# Patient Record
Sex: Female | Born: 1990 | Race: Asian | Hispanic: No | Marital: Married | Smoking: Never smoker
Health system: Southern US, Community
[De-identification: ages and names within clinical notes are randomized; demographics above are authoritative.]

## PROBLEM LIST (undated history)

## (undated) ENCOUNTER — Inpatient Hospital Stay (HOSPITAL_COMMUNITY): Payer: Managed Care, Other (non HMO)

## (undated) DIAGNOSIS — O24419 Gestational diabetes mellitus in pregnancy, unspecified control: Secondary | ICD-10-CM

## (undated) DIAGNOSIS — F419 Anxiety disorder, unspecified: Secondary | ICD-10-CM

## (undated) DIAGNOSIS — R519 Headache, unspecified: Secondary | ICD-10-CM

## (undated) DIAGNOSIS — Z789 Other specified health status: Secondary | ICD-10-CM

## (undated) HISTORY — PX: WISDOM TOOTH EXTRACTION: SHX21

## (undated) HISTORY — DX: Gestational diabetes mellitus in pregnancy, unspecified control: O24.419

---

## 2015-08-28 NOTE — L&D Delivery Note (Addendum)
Delivery Note At 11:13 PM a viable female was delivered via Vaginal, Spontaneous Delivery (Presentation:vertex ; LOA ).  APGAR:8 ,9 ; weight  .   Placenta status:spont ,shultz .  Cord: 3vc with the following complications:none .  Cord pH: n/a  Anesthesia:  local Episiotomy:  none Lacerations: 2nd  Suture Repair: 3.0 vicryl Est. Blood Loss (mL): 100  Mom to postpartum.  Baby to Couplet care / Skin to Skin.  Patricia Barber 06/17/2016, 11:34 PM

## 2016-02-06 LAB — OB RESULTS CONSOLE HGB/HCT, BLOOD
HEMATOCRIT: 34 %
Hemoglobin: 11.2 g/dL

## 2016-02-06 LAB — OB RESULTS CONSOLE HEPATITIS B SURFACE ANTIGEN: HEP B S AG: NEGATIVE

## 2016-02-06 LAB — OB RESULTS CONSOLE ANTIBODY SCREEN: Antibody Screen: NEGATIVE

## 2016-02-06 LAB — OB RESULTS CONSOLE RPR: RPR: NONREACTIVE

## 2016-02-06 LAB — OB RESULTS CONSOLE ABO/RH: RH TYPE: POSITIVE

## 2016-02-06 LAB — OB RESULTS CONSOLE GC/CHLAMYDIA
CHLAMYDIA, DNA PROBE: NEGATIVE
Gonorrhea: NEGATIVE

## 2016-02-06 LAB — OB RESULTS CONSOLE RUBELLA ANTIBODY, IGM: RUBELLA: NON-IMMUNE/NOT IMMUNE

## 2016-02-06 LAB — OB RESULTS CONSOLE HIV ANTIBODY (ROUTINE TESTING): HIV: NONREACTIVE

## 2016-02-06 LAB — OB RESULTS CONSOLE PLATELET COUNT: PLATELETS: 268 10*3/uL

## 2016-03-23 ENCOUNTER — Ambulatory Visit (INDEPENDENT_AMBULATORY_CARE_PROVIDER_SITE_OTHER): Payer: Managed Care, Other (non HMO) | Admitting: Family

## 2016-03-23 ENCOUNTER — Encounter: Payer: Self-pay | Admitting: Family

## 2016-03-23 VITALS — BP 112/71 | HR 85 | Ht 64.0 in | Wt 172.0 lb

## 2016-03-23 DIAGNOSIS — O0930 Supervision of pregnancy with insufficient antenatal care, unspecified trimester: Secondary | ICD-10-CM | POA: Insufficient documentation

## 2016-03-23 DIAGNOSIS — Z3482 Encounter for supervision of other normal pregnancy, second trimester: Secondary | ICD-10-CM

## 2016-03-23 DIAGNOSIS — Z3480 Encounter for supervision of other normal pregnancy, unspecified trimester: Secondary | ICD-10-CM | POA: Insufficient documentation

## 2016-03-23 DIAGNOSIS — O0933 Supervision of pregnancy with insufficient antenatal care, third trimester: Secondary | ICD-10-CM

## 2016-03-23 DIAGNOSIS — Z3490 Encounter for supervision of normal pregnancy, unspecified, unspecified trimester: Secondary | ICD-10-CM

## 2016-03-23 DIAGNOSIS — Z3493 Encounter for supervision of normal pregnancy, unspecified, third trimester: Secondary | ICD-10-CM

## 2016-03-23 NOTE — Progress Notes (Signed)
.     Subjective:    Patricia Barber is a G1P0 [redacted]w[redacted]d being seen today for her first obstetrical visit.  Transfer from [facility].  Began prenatal care at 22 wks.  Her obstetrical history is significant for late prenatal care. Patient does intend to breast feed. Desires low intervention birth, considering waterbirth.  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  Pregnancy history fully reviewed.  Patient reports no complaints.  Vitals:   03/23/16 0904 03/23/16 0906  BP: 112/71   Pulse: 85   Weight: 172 lb (78 kg)   Height:  5\' 4"  (1.626 m)    HISTORY: OB History  Gravida Para Term Preterm AB Living  1            SAB TAB Ectopic Multiple Live Births               # Outcome Date GA Lbr Len/2nd Weight Sex Delivery Anes PTL Lv  1 Current              No past medical history on file. No past surgical history on file. No family history on file.   Exam   Vitals:   03/23/16 0904 03/23/16 0906  BP: 112/71   Pulse: 85   Weight: 172 lb (78 kg)   Height:  5\' 4"  (1.626 m)    Fetal Status: Fetal Heart Rate (bpm): 151 Fundal Height: 29 cm Movement: Present     General:  Alert, oriented and cooperative. Patient is in no acute distress.  Skin: Skin is warm and dry. No rash noted.   Cardiovascular: Normal heart rate noted  Respiratory: Normal respiratory effort, no problems with respiration noted  Abdomen: Soft, gravid, appropriate for gestational age. Pain/Pressure: Absent     Pelvic: Vag. Bleeding: None Vag D/C Character: Thin   Cervical exam deferred        Extremities: Normal range of motion.  Edema: None  Mental Status: Normal mood and affect. Normal behavior. Normal judgment and thought content.   Urinalysis: Urine Protein: Negative Urine Glucose: Negative    Assessment:    Pregnancy: G1P0 Patient Active Problem List   Diagnosis Date Noted  . Supervision of normal pregnancy, antepartum 03/23/2016        Plan:     OB records reviewed.  Third  trimester labs obtained today. Prenatal vitamins. Given info on waterbirth. Problem list reviewed and updated.  Ultrasound discussed; fetal survey: results reviewed.  Follow up in 2 weeks.  Marlis Edelson 03/23/2016

## 2016-03-23 NOTE — Progress Notes (Signed)
Pt is here for 28 week visit with labs.  She found out pregnant @ 21 weeks and has been see @ Dr Eliane Decree office.  She wants a midwife experience so transferred care here.

## 2016-03-23 NOTE — Patient Instructions (Addendum)
Thinking About Patricia Barber???  You must attend a Patricia Barber class at [facility]  3rd Wednesday of every month from 7-9pm  Free  [employer] by calling [phone] or online at [email or web address].[email or web address]  Bring [place] the certificate from the class  Waterbirth supplies needed for [facility]/[place]/Health Department patients:  Our practice has a Heritage manager in a Box tub at the hospital that you can borrow  You will need to purchase an accessory kit that has all needed supplies through [facility] [phone]) or online $175.00  Or you can purchase the supplies separately: o Single-use disposable tub liner for Birth Pool in a Box (REGULAR size) o New garden hose labeled "lead-free", "suitable for drinking water", o Electric drain pump to remove water (We recommend 792 gallon per hour or greater pump.)  o  "non-toxic" OR "water potable" o Garden hose to remove the dirty water o Fish net o Bathing suit top (optional) o Long-handled mirror (optional)  [employer] sells tubs for ~ $120 if you would rather purchase your own tub.  They also sell accessories, liners.    Www.waterbirthsolutions.com for tub purchases and supplies  [employer] (www.thelaborladies.com) $275 for tub rental/set-up & take down/kit   [employer] information regarding doulas (labor support) who provide pool rentals:  IdentityList.se.htm   [employer] (www.thelaborladies.com)  IdentityList.se.htm   Things that would prevent you from having a waterbirth:  Premature, <37wks  Previous cesarean birth  Presence of thick meconium-stained fluid  Multiple gestation (Twins, triplets, etc.)  Uncontrolled diabetes or gestational diabetes requiring medication  Hypertension  Heavy vaginal bleeding  Non-reassuring fetal heart rate  Active infection (MRSA, etc.)  If your labor has to be induced and induction  method requires continuous monitoring of the baby's heart rate  Other risks/issues identified by your obstetrical provider    Third Trimester of Pregnancy The third trimester is from week 29 through week 42, months 7 through 9. The third trimester is a time when the fetus is growing rapidly. At the end of the ninth month, the fetus is about 20 inches in length and weighs 6-10 pounds.  BODY CHANGES Your body goes through many changes during pregnancy. The changes vary from woman to woman.   Your weight will continue to increase. You can expect to gain 25-35 pounds (11-16 kg) by the end of the pregnancy.  You may begin to get stretch marks on your hips, abdomen, and breasts.  You may urinate more often because the fetus is moving lower into your pelvis and pressing on your bladder.  You may develop or continue to have heartburn as a result of your pregnancy.  You may develop constipation because certain hormones are causing the muscles that push waste through your intestines to slow down.  You may develop hemorrhoids or swollen, bulging veins (varicose veins).  You may have pelvic pain because of the weight gain and pregnancy hormones relaxing your joints between the bones in your pelvis. Backaches may result from overexertion of the muscles supporting your posture.  You may have changes in your hair. These can include thickening of your hair, rapid growth, and changes in texture. Some women also have hair loss during or after pregnancy, or hair that feels dry or thin. Your hair will most likely return to normal after your baby is born.  Your breasts will continue to grow and be tender. A yellow discharge may leak from your breasts called colostrum.  Your belly button may stick out.  You may feel short of breath because of your expanding uterus.  You may notice the fetus "dropping," or moving lower in your abdomen.  You may have a bloody mucus discharge. This usually occurs a few  days to a week before labor begins.  Your cervix becomes thin and soft (effaced) near your due date. WHAT TO EXPECT AT YOUR PRENATAL EXAMS  You will have prenatal exams every 2 weeks until week 36. Then, you will have weekly prenatal exams. During a routine prenatal visit:  You will be weighed to make sure you and the fetus are growing normally.  Your blood pressure is taken.  Your abdomen will be measured to track your baby's growth.  The fetal heartbeat will be listened to.  Any test results from the previous visit will be discussed.  You may have a cervical check near your due date to see if you have effaced. At around 36 weeks, your caregiver will check your cervix. At the same time, your caregiver will also perform a test on the secretions of the vaginal tissue. This test is to determine if a type of bacteria, Group B streptococcus, is present. Your caregiver will explain this further. Your caregiver may ask you:  What your birth plan is.  How you are feeling.  If you are feeling the baby move.  If you have had any abnormal symptoms, such as leaking fluid, bleeding, severe headaches, or abdominal cramping.  If you are using any tobacco products, including cigarettes, chewing tobacco, and electronic cigarettes.  If you have any questions. Other tests or screenings that may be performed during your third trimester include:  Blood tests that check for low iron levels (anemia).  Fetal testing to check the health, activity level, and growth of the fetus. Testing is done if you have certain medical conditions or if there are problems during the pregnancy.  HIV (human immunodeficiency virus) testing. If you are at high risk, you may be screened for HIV during your third trimester of pregnancy. FALSE LABOR You may feel small, irregular contractions that eventually go away. These are called Braxton Hicks contractions, or false labor. Contractions may last for hours, days, or even  weeks before true labor sets in. If contractions come at regular intervals, intensify, or become painful, it is best to be seen by your caregiver.  SIGNS OF LABOR   Menstrual-like cramps.  Contractions that are 5 minutes apart or less.  Contractions that start on the top of the uterus and spread down to the lower abdomen and back.  A sense of increased pelvic pressure or back pain.  A watery or bloody mucus discharge that comes from the vagina. If you have any of these signs before the 37th week of pregnancy, call your caregiver right away. You need to go to the hospital to get checked immediately. HOME CARE INSTRUCTIONS   Avoid all smoking, herbs, alcohol, and unprescribed drugs. These chemicals affect the formation and growth of the baby.  Do not use any tobacco products, including cigarettes, chewing tobacco, and electronic cigarettes. If you need help quitting, ask your health care provider. You may receive counseling support and other resources to help you quit.  Follow your caregiver's instructions regarding medicine use. There are medicines that are either safe or unsafe to take during pregnancy.  Exercise only as directed by your caregiver. Experiencing uterine cramps is a good sign to stop exercising.  Continue to eat regular, healthy meals.  Wear a good support bra for  breast tenderness.  Do not use hot tubs, steam rooms, or saunas.  Wear your seat belt at all times when driving.  Avoid raw meat, uncooked cheese, cat litter boxes, and soil used by cats. These carry germs that can cause birth defects in the baby.  Take your prenatal vitamins.  Take 1500-2000 mg of calcium daily starting at the 20th week of pregnancy until you deliver your baby.  Try taking a stool softener (if your caregiver approves) if you develop constipation. Eat more high-fiber foods, such as fresh vegetables or fruit and whole grains. Drink plenty of fluids to keep your urine clear or pale  yellow.  Take warm sitz baths to soothe any pain or discomfort caused by hemorrhoids. Use hemorrhoid cream if your caregiver approves.  If you develop varicose veins, wear support hose. Elevate your feet for 15 minutes, 3-4 times a day. Limit salt in your diet.  Avoid heavy lifting, wear low heal shoes, and practice good posture.  Rest a lot with your legs elevated if you have leg cramps or low back pain.  Visit your dentist if you have not gone during your pregnancy. Use a soft toothbrush to brush your teeth and be gentle when you floss.  A sexual relationship may be continued unless your caregiver directs you otherwise.  Do not travel far distances unless it is absolutely necessary and only with the approval of your caregiver.  Take prenatal classes to understand, practice, and ask questions about the labor and delivery.  Make a trial run to the hospital.  Pack your hospital bag.  Prepare the baby's nursery.  Continue to go to all your prenatal visits as directed by your caregiver. SEEK MEDICAL CARE IF:  You are unsure if you are in labor or if your water has broken.  You have dizziness.  You have mild pelvic cramps, pelvic pressure, or nagging pain in your abdominal area.  You have persistent nausea, vomiting, or diarrhea.  You have a bad smelling vaginal discharge.  You have pain with urination. SEEK IMMEDIATE MEDICAL CARE IF:   You have a fever.  You are leaking fluid from your vagina.  You have spotting or bleeding from your vagina.  You have severe abdominal cramping or pain.  You have rapid weight loss or gain.  You have shortness of breath with chest pain.  You notice sudden or extreme swelling of your face, hands, ankles, feet, or legs.  You have not felt your baby move in over an hour.  You have severe headaches that do not go away with medicine.  You have vision changes.   This information is not intended to replace advice given to you by your  health care provider. Make sure you discuss any questions you have with your health care provider.   Document Released: 08/07/2001 Document Revised: 09/03/2014 Document Reviewed: 10/14/2012 Elsevier Interactive Patient Education [employer].

## 2016-03-24 LAB — CBC
HEMATOCRIT: 40.3 % (ref 35.0–45.0)
Hemoglobin: 13.3 g/dL (ref 11.7–15.5)
MCH: 31.7 pg (ref 27.0–33.0)
MCHC: 33 g/dL (ref 32.0–36.0)
MCV: 96 fL (ref 80.0–100.0)
MPV: 10.7 fL (ref 7.5–12.5)
PLATELETS: 234 10*3/uL (ref 140–400)
RBC: 4.2 MIL/uL (ref 3.80–5.10)
RDW: 15.1 % — AB (ref 11.0–15.0)
WBC: 8.5 10*3/uL (ref 3.8–10.8)

## 2016-03-24 LAB — HIV ANTIBODY (ROUTINE TESTING W REFLEX): HIV 1&2 Ab, 4th Generation: NONREACTIVE

## 2016-03-24 LAB — GLUCOSE TOLERANCE, 1 HOUR (50G) W/O FASTING: Glucose, 1 Hr, gestational: 118 mg/dL (ref <140)

## 2016-03-24 LAB — RPR

## 2016-04-03 ENCOUNTER — Encounter: Payer: Self-pay | Admitting: Family

## 2016-04-06 ENCOUNTER — Encounter: Payer: Self-pay | Admitting: Family

## 2016-04-13 ENCOUNTER — Ambulatory Visit (INDEPENDENT_AMBULATORY_CARE_PROVIDER_SITE_OTHER): Payer: Managed Care, Other (non HMO) | Admitting: Advanced Practice Midwife

## 2016-04-13 VITALS — BP 105/65 | HR 70 | Wt 176.0 lb

## 2016-04-13 DIAGNOSIS — M549 Dorsalgia, unspecified: Secondary | ICD-10-CM

## 2016-04-13 DIAGNOSIS — O9989 Other specified diseases and conditions complicating pregnancy, childbirth and the puerperium: Secondary | ICD-10-CM

## 2016-04-13 DIAGNOSIS — R12 Heartburn: Secondary | ICD-10-CM

## 2016-04-13 DIAGNOSIS — Z3493 Encounter for supervision of normal pregnancy, unspecified, third trimester: Secondary | ICD-10-CM

## 2016-04-13 DIAGNOSIS — O26893 Other specified pregnancy related conditions, third trimester: Secondary | ICD-10-CM

## 2016-04-13 NOTE — Progress Notes (Signed)
Subjective:  Patricia Barber is a 25 y.o. G1P0 at 6574w2d being seen today for ongoing prenatal care.  She is currently monitored for the following issues for this low-risk pregnancy and has Supervision of normal pregnancy, antepartum and Late prenatal care affecting pregnancy, antepartum on her problem list.  Patient reports daily back pain mostly at night and heartburn not well treated by Tums.  Contractions: Not present. Vag. Bleeding: None.  Movement: Present. Denies leaking of fluid.   The following portions of the patient's history were reviewed and updated as appropriate: allergies, current medications, past family history, past medical history, past social history, past surgical history and problem list. Problem list updated.  Objective:   Vitals:   04/13/16 0910  BP: 105/65  Pulse: 70  Weight: 176 lb (79.8 kg)    Fetal Status: Fetal Heart Rate (bpm): 140 Fundal Height: 31 cm Movement: Present     General:  Alert, oriented and cooperative. Patient is in no acute distress.  Skin: Skin is warm and dry. No rash noted.   Cardiovascular: Normal heart rate noted  Respiratory: Normal respiratory effort, no problems with respiration noted  Abdomen: Soft, gravid, appropriate for gestational age. Pain/Pressure: Absent     Pelvic:  Cervical exam deferred        Extremities: Normal range of motion.  Edema: None  Mental Status: Normal mood and affect. Normal behavior. Normal judgment and thought content.   Urinalysis: Urine Protein: Negative Urine Glucose: Negative  Assessment and Plan:  Pregnancy: G1P0 at 5974w2d  1. Supervision of normal pregnancy, third trimester --Pt worried about recent weight gain, overall weight gain between 15-20 lbs.  Weight gain is appropriate and should increase in third trimester. Pt eats a healthy diet. No changes recommended.  2. Back pain affecting pregnancy --Recommend pregnancy support belt, rest/ice/heat/Tylenol.  Printed teaching given.  3. Heartburn  during pregnancy in third trimester --Try Zantac 150 mg BID PRN  Preterm labor symptoms and general obstetric precautions including but not limited to vaginal bleeding, contractions, leaking of fluid and fetal movement were reviewed in detail with the patient. Please refer to After Visit Summary for other counseling recommendations.  Return in about 2 weeks (around 04/27/2016).   Hurshel PartyLisa A Leftwich-Kirby, CNM

## 2016-04-13 NOTE — Patient Instructions (Addendum)
Third Trimester of Pregnancy The third trimester is from week 29 through week 42, months 7 through 9. The third trimester is a time when the fetus is growing rapidly. At the end of the ninth month, the fetus is about 20 inches in length and weighs 6-10 pounds.  BODY CHANGES Your body goes through many changes during pregnancy. The changes vary from woman to woman.   Your weight will continue to increase. You can expect to gain 25-35 pounds (11-16 kg) by the end of the pregnancy.  You may begin to get stretch marks on your hips, abdomen, and breasts.  You may urinate more often because the fetus is moving lower into your pelvis and pressing on your bladder.  You may develop or continue to have heartburn as a result of your pregnancy.  You may develop constipation because certain hormones are causing the muscles that push waste through your intestines to slow down.  You may develop hemorrhoids or swollen, bulging veins (varicose veins).  You may have pelvic pain because of the weight gain and pregnancy hormones relaxing your joints between the bones in your pelvis. Backaches may result from overexertion of the muscles supporting your posture.  You may have changes in your hair. These can include thickening of your hair, rapid growth, and changes in texture. Some women also have hair loss during or after pregnancy, or hair that feels dry or thin. Your hair will most likely return to normal after your baby is born.  Your breasts will continue to grow and be tender. A yellow discharge may leak from your breasts called colostrum.  Your belly button may stick out.  You may feel short of breath because of your expanding uterus.  You may notice the fetus "dropping," or moving lower in your abdomen.  You may have a bloody mucus discharge. This usually occurs a few days to a week before labor begins.  Your cervix becomes thin and soft (effaced) near your due date. WHAT TO EXPECT AT YOUR PRENATAL  EXAMS  You will have prenatal exams every 2 weeks until week 36. Then, you will have weekly prenatal exams. During a routine prenatal visit:  You will be weighed to make sure you and the fetus are growing normally.  Your blood pressure is taken.  Your abdomen will be measured to track your baby's growth.  The fetal heartbeat will be listened to.  Any test results from the previous visit will be discussed.  You may have a cervical check near your due date to see if you have effaced. At around 36 weeks, your caregiver will check your cervix. At the same time, your caregiver will also perform a test on the secretions of the vaginal tissue. This test is to determine if a type of bacteria, Group B streptococcus, is present. Your caregiver will explain this further. Your caregiver may ask you:  What your birth plan is.  How you are feeling.  If you are feeling the baby move.  If you have had any abnormal symptoms, such as leaking fluid, bleeding, severe headaches, or abdominal cramping.  If you are using any tobacco products, including cigarettes, chewing tobacco, and electronic cigarettes.  If you have any questions. Other tests or screenings that may be performed during your third trimester include:  Blood tests that check for low iron levels (anemia).  Fetal testing to check the health, activity level, and growth of the fetus. Testing is done if you have certain medical conditions or if   there are problems during the pregnancy.  HIV (human immunodeficiency virus) testing. If you are at high risk, you may be screened for HIV during your third trimester of pregnancy. FALSE LABOR You may feel small, irregular contractions that eventually go away. These are called Braxton Hicks contractions, or false labor. Contractions may last for hours, days, or even weeks before true labor sets in. If contractions come at regular intervals, intensify, or become painful, it is best to be seen by your  caregiver.  SIGNS OF LABOR   Menstrual-like cramps.  Contractions that are 5 minutes apart or less.  Contractions that start on the top of the uterus and spread down to the lower abdomen and back.  A sense of increased pelvic pressure or back pain.  A watery or bloody mucus discharge that comes from the vagina. If you have any of these signs before the 37th week of pregnancy, call your caregiver right away. You need to go to the hospital to get checked immediately. HOME CARE INSTRUCTIONS   Avoid all smoking, herbs, alcohol, and unprescribed drugs. These chemicals affect the formation and growth of the baby.  Do not use any tobacco products, including cigarettes, chewing tobacco, and electronic cigarettes. If you need help quitting, ask your health care provider. You may receive counseling support and other resources to help you quit.  Follow your caregiver's instructions regarding medicine use. There are medicines that are either safe or unsafe to take during pregnancy.  Exercise only as directed by your caregiver. Experiencing uterine cramps is a good sign to stop exercising.  Continue to eat regular, healthy meals.  Wear a good support bra for breast tenderness.  Do not use hot tubs, steam rooms, or saunas.  Wear your seat belt at all times when driving.  Avoid raw meat, uncooked cheese, cat litter boxes, and soil used by cats. These carry germs that can cause birth defects in the baby.  Take your prenatal vitamins.  Take 1500-2000 mg of calcium daily starting at the 20th week of pregnancy until you deliver your baby.  Try taking a stool softener (if your caregiver approves) if you develop constipation. Eat more high-fiber foods, such as fresh vegetables or fruit and whole grains. Drink plenty of fluids to keep your urine clear or pale yellow.  Take warm sitz baths to soothe any pain or discomfort caused by hemorrhoids. Use hemorrhoid cream if your caregiver approves.  If  you develop varicose veins, wear support hose. Elevate your feet for 15 minutes, 3-4 times a day. Limit salt in your diet.  Avoid heavy lifting, wear low heal shoes, and practice good posture.  Rest a lot with your legs elevated if you have leg cramps or low back pain.  Visit your dentist if you have not gone during your pregnancy. Use a soft toothbrush to brush your teeth and be gentle when you floss.  A sexual relationship may be continued unless your caregiver directs you otherwise.  Do not travel far distances unless it is absolutely necessary and only with the approval of your caregiver.  Take prenatal classes to understand, practice, and ask questions about the labor and delivery.  Make a trial run to the hospital.  Pack your hospital bag.  Prepare the baby's nursery.  Continue to go to all your prenatal visits as directed by your caregiver. SEEK MEDICAL CARE IF:  You are unsure if you are in labor or if your water has broken.  You have dizziness.  You have   mild pelvic cramps, pelvic pressure, or nagging pain in your abdominal area.  You have persistent nausea, vomiting, or diarrhea.  You have a bad smelling vaginal discharge.  You have pain with urination. SEEK IMMEDIATE MEDICAL CARE IF:   You have a fever.  You are leaking fluid from your vagina.  You have spotting or bleeding from your vagina.  You have severe abdominal cramping or pain.  You have rapid weight loss or gain.  You have shortness of breath with chest pain.  You notice sudden or extreme swelling of your face, hands, ankles, feet, or legs.  You have not felt your baby move in over an hour.  You have severe headaches that do not go away with medicine.  You have vision changes.   This information is not intended to replace advice given to you by your health care provider. Make sure you discuss any questions you have with your health care provider.   Document Released: 08/07/2001 Document  Revised: 09/03/2014 Document Reviewed: 10/14/2012 Elsevier Interactive Patient Education 2016 [employer] Back Pain in Pregnancy Back pain during pregnancy is common. It happens in about half of all pregnancies. It is important for you and your baby that you remain active during your pregnancy.If you feel that back pain is not allowing you to remain active or sleep well, it is time to see your caregiver. Back pain may be caused by several factors related to changes during your pregnancy.Fortunately, unless you had trouble with your back before your pregnancy, the pain is likely to get better after you deliver. Low back pain usually occurs between the fifth and seventh months of pregnancy. It can, however, happen in the first couple months. Factors that increase the risk of back problems include:   Previous back problems.  Injury to your back.  Having twins or multiple births.  A chronic cough.  Stress.  Job-related repetitive motions.  Muscle or spinal disease in the back.  Family history of back problems, ruptured (herniated) discs, or osteoporosis.  Depression, anxiety, and panic attacks. CAUSES   When you are pregnant, your body produces a hormone called relaxin. This hormonemakes the ligaments connecting the low back and pubic bones more flexible. This flexibility allows the baby to be delivered more easily. When your ligaments are loose, your muscles need to work harder to support your back. Soreness in your back can come from tired muscles. Soreness can also come from back tissues that are irritated since they are receiving less support.  As the baby grows, it puts pressure on the nerves and blood vessels in your pelvis. This can cause back pain.  As the baby grows and gets heavier during pregnancy, the uterus pushes the stomach muscles forward and changes your center of gravity. This makes your back muscles work harder to maintain good posture. SYMPTOMS  Lumbar pain during  pregnancy Lumbar pain during pregnancy usually occurs at or above the waist in the center of the back. There may be pain and numbness that radiates into your leg or foot. This is similar to low back pain experienced by non-pregnant women. It usually increases with sitting for long periods of time, standing, or repetitive lifting. Tenderness may also be present in the muscles along your upper back. Posterior pelvic pain during pregnancy Pain in the back of the pelvis is more common than lumbar pain in pregnancy. It is a deep pain felt in your side at the waistline, or across the tailbone (sacrum), or in both places.   You may have pain on one or both sides. This pain can also go into the buttocks and backs of the upper thighs. Pubic and groin pain may also be present. The pain does not quickly resolve with rest, and morning stiffness may also be present. Pelvic pain during pregnancy can be brought on by most activities. A high level of fitness before and during pregnancy may or may not prevent this problem. Labor pain is usually 1 to 2 minutes apart, lasts for about 1 minute, and involves a bearing down feeling or pressure in your pelvis. However, if you are at term with the pregnancy, constant low back pain can be the beginning of early labor, and you should be aware of this. DIAGNOSIS  X-rays of the back should not be done during the first 12 to 14 weeks of the pregnancy and only when absolutely necessary during the rest of the pregnancy. MRIs do not give off radiation and are safe during pregnancy. MRIs also should only be done when absolutely necessary. HOME CARE INSTRUCTIONS  Exercise as directed by your caregiver. Exercise is the most effective way to prevent or manage back pain. If you have a back problem, it is especially important to avoid sports that require sudden body movements. Swimming and walking are great activities.  Do not stand in one place for long periods of time.  Do not wear high  heels.  Sit in chairs with good posture. Use a pillow on your lower back if necessary. Make sure your head rests over your shoulders and is not hanging forward.  Try sleeping on your side, preferably the left side, with a pillow or two between your legs. If you are sore after a night's rest, your bedmay betoo soft.Try placing a board between your mattress and box spring.  Listen to your body when lifting.If you are experiencing pain, ask for help or try bending yourknees more so you can use your leg muscles rather than your back muscles. Squat down when picking up something from the floor. Do not bend over.  Eat a healthy diet. Try to gain weight within your caregiver's recommendations.  Use heat or cold packs 3 to 4 times a day for 15 minutes to help with the pain.  Only take over-the-counter or prescription medicines for pain, discomfort, or fever as directed by your caregiver. Sudden (acute) back pain  Use bed rest for only the most extreme, acute episodes of back pain. Prolonged bed rest over 48 hours will aggravate your condition.  Ice is very effective for acute conditions.  Put ice in a plastic bag.  Place a towel between your skin and the bag.  Leave the ice on for 10 to 20 minutes every 2 hours, or as needed.  Using heat packs for 30 minutes prior to activities is also helpful. Continued back pain See your caregiver if you have continued problems. Your caregiver can help or refer you for appropriate physical therapy. With conditioning, most back problems can be avoided. Sometimes, a more serious issue may be the cause of back pain. You should be seen right away if new problems seem to be developing. Your caregiver may recommend:  A maternity girdle.  An elastic sling.  A back brace.  A massage therapist or acupuncture. SEEK MEDICAL CARE IF:   You are not able to do most of your daily activities, even when taking the pain medicine you were given.  You need a  referral to a physical therapist or chiropractor.    You want to try acupuncture. SEEK IMMEDIATE MEDICAL CARE IF:  You develop numbness, tingling, weakness, or problems with the use of your arms or legs.  You develop severe back pain that is no longer relieved with medicines.  You have a sudden change in bowel or bladder control.  You have increasing pain in other areas of the body.  You develop shortness of breath, dizziness, or fainting.  You develop nausea, vomiting, or sweating.  You have back pain which is similar to labor pains.  You have back pain along with your water breaking or vaginal bleeding.  You have back pain or numbness that travels down your leg.  Your back pain developed after you fell.  You develop pain on one side of your back. You may have a kidney stone.  You see blood in your urine. You may have a bladder infection or kidney stone.  You have back pain with blisters. You may have shingles. Back pain is fairly common during pregnancy but should not be accepted as just part of the process. Back pain should always be treated as soon as possible. This will make your pregnancy as pleasant as possible.   This information is not intended to replace advice given to you by your health care provider. Make sure you discuss any questions you have with your health care provider.   Document Released: 11/21/2005 Document Revised: 11/05/2011 Document Reviewed: 01/02/2011 Elsevier Interactive Patient Education 2016 [employer] Inc[employer]  

## 2016-05-01 ENCOUNTER — Encounter: Payer: Managed Care, Other (non HMO) | Admitting: Obstetrics & Gynecology

## 2016-05-04 ENCOUNTER — Ambulatory Visit (INDEPENDENT_AMBULATORY_CARE_PROVIDER_SITE_OTHER): Payer: Managed Care, Other (non HMO) | Admitting: Family

## 2016-05-04 VITALS — BP 101/69 | HR 76 | Wt 181.0 lb

## 2016-05-04 DIAGNOSIS — Z3483 Encounter for supervision of other normal pregnancy, third trimester: Secondary | ICD-10-CM

## 2016-05-04 DIAGNOSIS — Z3493 Encounter for supervision of normal pregnancy, unspecified, third trimester: Secondary | ICD-10-CM

## 2016-05-04 NOTE — Progress Notes (Signed)
   PRENATAL VISIT NOTE  Subjective:  Wonda Ceriseaijla Devinney is a 25 y.o. G1P0 at 25159w2d being seen today for ongoing prenatal care.  She is currently monitored for the following issues for this low-risk pregnancy and has Supervision of normal pregnancy, antepartum and Late prenatal care affecting pregnancy, antepartum on her problem list.  Patient reports leg cramps at night.  Taking magnesium supplements.  Contractions: Irritability. Vag. Bleeding: None.  Movement: Present. Denies leaking of fluid.   The following portions of the patient's history were reviewed and updated as appropriate: allergies, current medications, past family history, past medical history, past social history, past surgical history and problem list. Problem list updated.  Objective:   Vitals:   05/04/16 0919  BP: 101/69  Pulse: 76  Weight: 181 lb (82.1 kg)    Fetal Status: Fetal Heart Rate (bpm): 133 Fundal Height: 34 cm Movement: Present     General:  Alert, oriented and cooperative. Patient is in no acute distress.  Skin: Skin is warm and dry. No rash noted.   Cardiovascular: Normal heart rate noted  Respiratory: Normal respiratory effort, no problems with respiration noted  Abdomen: Soft, gravid, appropriate for gestational age. Pain/Pressure: Absent     Pelvic:  Cervical exam deferred        Extremities: Normal range of motion.  Edema: None  Mental Status: Normal mood and affect. Normal behavior. Normal judgment and thought content.   Urinalysis: Urine Protein: Negative Urine Glucose: Negative  Assessment and Plan:  Pregnancy: G1P0 at 25359w2d  1. Supervision of normal pregnancy, antepartum, third trimester - Discussed GBS testing at next visit - Reviewed signs/stages of labor with Muhammad/Naileah - Plans to have tub manager for waterbirth > consents at next visit   Preterm labor symptoms and general obstetric precautions including but not limited to vaginal bleeding, contractions, leaking of fluid and fetal  movement were reviewed in detail with the patient. Please refer to After Visit Summary for other counseling recommendations.  Return in 2 weeks (on 05/18/2016).  Eino FarberWalidah Kennith GainN Karim, CNM

## 2016-05-04 NOTE — Patient Instructions (Addendum)
LEG CRAMPS - Leg cramps are common, usually occurring during the latter half of pregnancy. The cramps are due to painful muscle contractions and are generally experienced in the calves at night. They are thought to be secondary to a buildup of lactic and pyruvic acids leading to involuntary contraction of the affected muscles, but the exact etiology is unknown.  A Cochrane review found that the only placebo-controlled trial of calcium treatment showed no evidence of benefit in the treatment of leg cramps, but placebo controlled trials of magnesium supplementation suggested a possible benefit (odds ratio 0.18, 95 percent CI 0.05 to 0.60). This was a small trial of 1869 pregnant women with persistent leg cramps. The preparation used was magnesium lactate or citrate 5 mmol in the morning and 10 mmol in the evening.    Stretching exercises may be an effective preventive measure. These can be performed in the weight-bearing position; they are held for 20 seconds and repeated three times in succession, four times daily for one week, then twice daily thereafter.  If a cramp occurs, calf stretches (toe raises), walking, or leg jiggling followed by leg elevation may be helpful. Other nonpharmacologic remedies include: ?A hot shower or warm tub bath ?Ice massage ?Regular exercise for conditioning, calf strengthening and stretching ?Increased hydration ?Use of long-countered shoes and other proper foot gear.   Group B streptococcus (GBS) is a type of bacteria often found in healthy women. GBS is not the same as the bacteria that causes strep throat. You may have GBS in your vagina, rectum, or bladder. GBS does not spread through sexual contact, but it can be passed to a baby during childbirth. This can be dangerous for your baby. It is not dangerous to you and usually does not cause any symptoms. Your health care provider may test you for GBS when your pregnancy is between 35 and 37 weeks. GBS is dangerous only  during birth, so there is no need to test for it earlier. It is possible to have GBS during pregnancy and never pass it to your baby. If your test results are positive for GBS, your health care provider may recommend giving you antibiotic medicine during delivery to make sure your baby stays healthy. RISK FACTORS You are more likely to pass GBS to your baby if:   Your water breaks (ruptured membrane) or you go into labor before 37 weeks.  Your water breaks 18 hours before you deliver.  You passed GBS during a previous pregnancy.  You have a urinary tract infection caused by GBS any time during pregnancy.  You have a fever during labor. SYMPTOMS Most women who have GBS do not have any symptoms. If you have a urinary tract infection caused by GBS, you might have frequent or painful urination and fever. Babies who get GBS usually show symptoms within 7 days of birth. Symptoms may include:   Breathing problems.  Heart and blood pressure problems.  Digestive and kidney problems. DIAGNOSIS Routine screening for GBS is recommended for all pregnant women. A health care provider takes a sample of the fluid in your vagina and rectum with a swab. It is then sent to a lab to be checked for GBS. A sample of your urine may also be checked for the bacteria.  TREATMENT If you test positive for GBS, you may need treatment with an antibiotic medicine during labor. As soon as you go into labor, or as soon as your membranes rupture, you will get the antibiotic medicine through an  IV access. You will continue to get the medicine until after you give birth. You do not need antibiotic medicine if you are having a cesarean delivery.If your baby shows signs or symptoms of GBS after birth, your baby can also be treated with an antibiotic medicine. HOME CARE INSTRUCTIONS   Take all antibiotic medicine as prescribed by your health care provider. Only take medicine as directed.   Continue with prenatal visits and  care.   Keep all follow-up appointments.  SEEK MEDICAL CARE IF:   You have pain when you urinate.   You have to urinate frequently.   You have a fever.  SEEK IMMEDIATE MEDICAL CARE IF:   Your membranes rupture.  You go into labor.   This information is not intended to replace advice given to you by your health care provider. Make sure you discuss any questions you have with your health care provider.   Document Released: 11/20/2007 Document Revised: 08/18/2013 Document Reviewed: 06/05/2013 Elsevier Interactive Patient Education [employer]

## 2016-05-18 ENCOUNTER — Encounter: Payer: Managed Care, Other (non HMO) | Admitting: Advanced Practice Midwife

## 2016-05-21 ENCOUNTER — Encounter (INDEPENDENT_AMBULATORY_CARE_PROVIDER_SITE_OTHER): Payer: Self-pay

## 2016-05-21 ENCOUNTER — Ambulatory Visit (INDEPENDENT_AMBULATORY_CARE_PROVIDER_SITE_OTHER): Payer: Managed Care, Other (non HMO) | Admitting: Obstetrics & Gynecology

## 2016-05-21 VITALS — BP 112/69 | HR 73 | Wt 183.0 lb

## 2016-05-21 DIAGNOSIS — O0933 Supervision of pregnancy with insufficient antenatal care, third trimester: Secondary | ICD-10-CM | POA: Diagnosis not present

## 2016-05-21 DIAGNOSIS — Z3493 Encounter for supervision of normal pregnancy, unspecified, third trimester: Secondary | ICD-10-CM

## 2016-05-21 DIAGNOSIS — Z3483 Encounter for supervision of other normal pregnancy, third trimester: Secondary | ICD-10-CM

## 2016-05-21 LAB — OB RESULTS CONSOLE GBS: STREP GROUP B AG: NEGATIVE

## 2016-05-21 NOTE — Progress Notes (Signed)
   PRENATAL VISIT NOTE  Subjective:  Patricia Barber is a 25 y.o. G1P0 at 3756w5d being seen today for ongoing prenatal care.  She is currently monitored for the following issues for this low-risk pregnancy and has Supervision of normal pregnancy, antepartum and Late prenatal care affecting pregnancy, antepartum on her problem list.  Patient reports no complaints.  Contractions: Irritability. Vag. Bleeding: None.  Movement: Present. Denies leaking of fluid.   The following portions of the patient's history were reviewed and updated as appropriate: allergies, current medications, past family history, past medical history, past social history, past surgical history and problem list. Problem list updated.  Objective:   Vitals:   05/21/16 1339  BP: 112/69  Pulse: 73  Weight: 183 lb (83 kg)    Fetal Status:     Movement: Present     General:  Alert, oriented and cooperative. Patient is in no acute distress.  Skin: Skin is warm and dry. No rash noted.   Cardiovascular: Normal heart rate noted  Respiratory: Normal respiratory effort, no problems with respiration noted  Abdomen: Soft, gravid, appropriate for gestational age. Pain/Pressure: Present     Pelvic:  Cervical exam performed       closed/thick/high  Extremities: Normal range of motion.  Edema: Trace  Mental Status: Normal mood and affect. Normal behavior. Normal judgment and thought content.   Urinalysis:      Assessment and Plan:  Pregnancy: G1P0 at 7056w5d  1. Normal pregnancy, third trimester  - Culture, beta strep (group b only) - Urine cytology ancillary only  2. Late prenatal care affecting pregnancy, antepartum, third trimester   3. Supervision of normal pregnancy, antepartum, third trimester   Preterm labor symptoms and general obstetric precautions including but not limited to vaginal bleeding, contractions, leaking of fluid and fetal movement were reviewed in detail with the patient. Please refer to After Visit  Summary for other counseling recommendations.  No Follow-up on file.  Allie BossierMyra C Jaxx Huish, MD

## 2016-05-23 LAB — CULTURE, BETA STREP (GROUP B ONLY)

## 2016-05-25 ENCOUNTER — Encounter: Payer: Self-pay | Admitting: *Deleted

## 2016-05-28 ENCOUNTER — Ambulatory Visit (INDEPENDENT_AMBULATORY_CARE_PROVIDER_SITE_OTHER): Payer: Managed Care, Other (non HMO) | Admitting: Advanced Practice Midwife

## 2016-05-28 ENCOUNTER — Encounter: Payer: Managed Care, Other (non HMO) | Admitting: Advanced Practice Midwife

## 2016-05-28 ENCOUNTER — Encounter: Payer: Self-pay | Admitting: Advanced Practice Midwife

## 2016-05-28 DIAGNOSIS — Z3483 Encounter for supervision of other normal pregnancy, third trimester: Secondary | ICD-10-CM

## 2016-05-28 DIAGNOSIS — Z3400 Encounter for supervision of normal first pregnancy, unspecified trimester: Secondary | ICD-10-CM

## 2016-05-28 NOTE — Patient Instructions (Addendum)
Braxton Hicks Contractions Contractions of the uterus can occur throughout pregnancy. Contractions are not always a sign that you are in labor.  WHAT ARE BRAXTON HICKS CONTRACTIONS?  Contractions that occur before labor are called Braxton Hicks contractions, or false labor. Toward the end of pregnancy (32-34 weeks), these contractions can develop more often and may become more forceful. This is not true labor because these contractions do not result in opening (dilatation) and thinning of the cervix. They are sometimes difficult to tell apart from true labor because these contractions can be forceful and people have different pain tolerances. You should not feel embarrassed if you go to the hospital with false labor. Sometimes, the only way to tell if you are in true labor is for your health care provider to look for changes in the cervix. If there are no prenatal problems or other health problems associated with the pregnancy, it is completely safe to be sent home with false labor and await the onset of true labor. HOW CAN YOU TELL THE DIFFERENCE BETWEEN TRUE AND FALSE LABOR? False Labor  The contractions of false labor are usually shorter and not as hard as those of true labor.   The contractions are usually irregular.   The contractions are often felt in the front of the lower abdomen and in the groin.   The contractions may go away when you walk around or change positions while lying down.   The contractions get weaker and are shorter lasting as time goes on.   The contractions do not usually become progressively stronger, regular, and closer together as with true labor.  True Labor  Contractions in true labor last 30-70 seconds, become very regular, usually become more intense, and increase in frequency.   The contractions do not go away with walking.   The discomfort is usually felt in the top of the uterus and spreads to the lower abdomen and low back.   True labor can be  determined by your health care provider with an exam. This will show that the cervix is dilating and getting thinner.  WHAT TO REMEMBER  Keep up with your usual exercises and follow other instructions given by your health care provider.   Take medicines as directed by your health care provider.   Keep your regular prenatal appointments.   Eat and drink lightly if you think you are going into labor.   If Braxton Hicks contractions are making you uncomfortable:   Change your position from lying down or resting to walking, or from walking to resting.   Sit and rest in a tub of warm water.   Drink 2-3 glasses of water. Dehydration may cause these contractions.   Do slow and deep breathing several times an hour.  WHEN SHOULD I SEEK IMMEDIATE MEDICAL CARE? Seek immediate medical care if:  Your contractions become stronger, more regular, and closer together.   You have fluid leaking or gushing from your vagina.   You have a fever.   You pass blood-tinged mucus.   You have vaginal bleeding.   You have continuous abdominal pain.   You have low back pain that you never had before.   You feel your baby's head pushing down and causing pelvic pressure.   Your baby is not moving as much as it used to.    This information is not intended to replace advice given to you by your health care provider. Make sure you discuss any questions you have with your health care  provider.   Document Released: 08/13/2005 Document Revised: 08/18/2013 Document Reviewed: 05/25/2013 Elsevier Interactive Patient Education [employer]2016 [employer] Inc.  Contraception Choices Contraception (birth control) is the use of any methods or devices to prevent pregnancy. Below are some methods to help avoid pregnancy. HORMONAL METHODS   Contraceptive implant. This is a thin, plastic tube containing progesterone hormone. It does not contain estrogen hormone. Your health care provider inserts the tube in  the inner part of the upper arm. The tube can remain in place for up to 3 years. After 3 years, the implant must be removed. The implant prevents the ovaries from releasing an egg (ovulation), thickens the cervical mucus to prevent sperm from entering the uterus, and thins the lining of the inside of the uterus.  Progesterone-only injections. These injections are given every 3 months by your health care provider to prevent pregnancy. This synthetic progesterone hormone stops the ovaries from releasing eggs. It also thickens cervical mucus and changes the uterine lining. This makes it harder for sperm to survive in the uterus.  Birth control pills. These pills contain estrogen and progesterone hormone. They work by preventing the ovaries from releasing eggs (ovulation). They also cause the cervical mucus to thicken, preventing the sperm from entering the uterus. Birth control pills are prescribed by a health care provider.Birth control pills can also be used to treat heavy periods.  Minipill. This type of birth control pill contains only the progesterone hormone. They are taken every day of each month and must be prescribed by your health care provider.  Birth control patch. The patch contains hormones similar to those in birth control pills. It must be changed once a week and is prescribed by a health care provider.  Vaginal ring. The ring contains hormones similar to those in birth control pills. It is left in the vagina for 3 weeks, removed for 1 week, and then a new one is put back in place. The patient must be comfortable inserting and removing the ring from the vagina.A health care provider's prescription is necessary.  Emergency contraception. Emergency contraceptives prevent pregnancy after unprotected sexual intercourse. This pill can be taken right after sex or up to 5 days after unprotected sex. It is most effective the sooner you take the pills after having sexual intercourse. Most emergency  contraceptive pills are available without a prescription. Check with your pharmacist. Do not use emergency contraception as your only form of birth control. BARRIER METHODS   Female condom. This is a thin sheath (latex or rubber) that is worn over the penis during sexual intercourse. It can be used with spermicide to increase effectiveness.  Female condom. This is a soft, loose-fitting sheath that is put into the vagina before sexual intercourse.  Diaphragm. This is a soft, latex, dome-shaped barrier that must be fitted by a health care provider. It is inserted into the vagina, along with a spermicidal jelly. It is inserted before intercourse. The diaphragm should be left in the vagina for 6 to 8 hours after intercourse.  Cervical cap. This is a round, soft, latex or plastic cup that fits over the cervix and must be fitted by a health care provider. The cap can be left in place for up to 48 hours after intercourse.  Sponge. This is a soft, circular piece of polyurethane foam. The sponge has spermicide in it. It is inserted into the vagina after wetting it and before sexual intercourse.  Spermicides. These are chemicals that kill or block sperm from entering  the cervix and uterus. They come in the form of creams, jellies, suppositories, foam, or tablets. They do not require a prescription. They are inserted into the vagina with an applicator before having sexual intercourse. The process must be repeated every time you have sexual intercourse. INTRAUTERINE CONTRACEPTION  Intrauterine device (IUD). This is a T-shaped device that is put in a woman's uterus during a menstrual period to prevent pregnancy. There are 2 types:  Copper IUD. This type of IUD is wrapped in copper wire and is placed inside the uterus. Copper makes the uterus and fallopian tubes produce a fluid that kills sperm. It can stay in place for 10 years.  Hormone IUD. This type of IUD contains the hormone progestin (synthetic  progesterone). The hormone thickens the cervical mucus and prevents sperm from entering the uterus, and it also thins the uterine lining to prevent implantation of a fertilized egg. The hormone can weaken or kill the sperm that get into the uterus. It can stay in place for 3-5 years, depending on which type of IUD is used. PERMANENT METHODS OF CONTRACEPTION  Female tubal ligation. This is when the woman's fallopian tubes are surgically sealed, tied, or blocked to prevent the egg from traveling to the uterus.  Hysteroscopic sterilization. This involves placing a small coil or insert into each fallopian tube. Your doctor uses a technique called hysteroscopy to do the procedure. The device causes scar tissue to form. This results in permanent blockage of the fallopian tubes, so the sperm cannot fertilize the egg. It takes about 3 months after the procedure for the tubes to become blocked. You must use another form of birth control for these 3 months.  Female sterilization. This is when the female has the tubes that carry sperm tied off (vasectomy).This blocks sperm from entering the vagina during sexual intercourse. After the procedure, the man can still ejaculate fluid (semen). NATURAL PLANNING METHODS  Natural family planning. This is not having sexual intercourse or using a barrier method (condom, diaphragm, cervical cap) on days the woman could become pregnant.  Calendar method. This is keeping track of the length of each menstrual cycle and identifying when you are fertile.  Ovulation method. This is avoiding sexual intercourse during ovulation.  Symptothermal method. This is avoiding sexual intercourse during ovulation, using a thermometer and ovulation symptoms.  Post-ovulation method. This is timing sexual intercourse after you have ovulated. Regardless of which type or method of contraception you choose, it is important that you use condoms to protect against the transmission of sexually  transmitted infections (STIs). Talk with your health care provider about which form of contraception is most appropriate for you.   This information is not intended to replace advice given to you by your health care provider. Make sure you discuss any questions you have with your health care provider.   Document Released: 08/13/2005 Document Revised: 08/18/2013 Document Reviewed: 02/05/2013 Elsevier Interactive Patient Education [employer]2016 Elsevier Inc.

## 2016-05-28 NOTE — Progress Notes (Signed)
   PRENATAL VISIT NOTE  Subjective:  Patricia Barber is a 25 y.o. G1P0 at 4673w5d being seen today for ongoing prenatal care.  She is currently monitored for the following issues for this low-risk pregnancy and has Supervision of normal pregnancy, antepartum and Late prenatal care affecting pregnancy, antepartum on her problem list.  Patient reports occasional contractions.  Contractions: Irritability. Vag. Bleeding: None.  Movement: Present. Denies leaking of fluid.   The following portions of the patient's history were reviewed and updated as appropriate: allergies, current medications, past family history, past medical history, past social history, past surgical history and problem list. Problem list updated.  Objective:   Vitals:   05/28/16 0908  BP: 104/73  Pulse: 71  Weight: 186 lb (84.4 kg)    Fetal Status: Fetal Heart Rate (bpm): 141 Fundal Height: 38 cm Movement: Present  Presentation: Vertex  General:  Alert, oriented and cooperative. Patient is in no acute distress.  Skin: Skin is warm and dry. No rash noted.   Cardiovascular: Normal heart rate noted  Respiratory: Normal respiratory effort, no problems with respiration noted  Abdomen: Soft, gravid, appropriate for gestational age. Pain/Pressure: Present     Pelvic:  Cervical exam deferred        Extremities: Normal range of motion.  Edema: Trace  Mental Status: Normal mood and affect. Normal behavior. Normal judgment and thought content.   Urinalysis: Urine Protein: Negative Urine Glucose: Negative  GBS neg  Assessment and Plan:  Pregnancy: G1P0 at 9173w5d  There are no diagnoses linked to this encounter. Term labor symptoms and general obstetric precautions including but not limited to vaginal bleeding, contractions, leaking of fluid and fetal movement were reviewed in detail with the patient. Please refer to After Visit Summary for other counseling recommendations.  Return in 1 week (on 06/04/2016).  Dorathy KinsmanVirginia Carlos Heber,  CNM

## 2016-06-04 ENCOUNTER — Ambulatory Visit (INDEPENDENT_AMBULATORY_CARE_PROVIDER_SITE_OTHER): Payer: Managed Care, Other (non HMO) | Admitting: Certified Nurse Midwife

## 2016-06-04 DIAGNOSIS — Z3400 Encounter for supervision of normal first pregnancy, unspecified trimester: Secondary | ICD-10-CM

## 2016-06-04 DIAGNOSIS — Z3403 Encounter for supervision of normal first pregnancy, third trimester: Secondary | ICD-10-CM

## 2016-06-04 NOTE — Progress Notes (Signed)
Subjective:  Patricia Barber is a 25 y.o. G1P0 at 6741w5d being seen today for ongoing prenatal care.  She is currently monitored for the following issues for this low-risk pregnancy and has Supervision of normal pregnancy, antepartum and Late prenatal care affecting pregnancy, antepartum on her problem list.  Patient reports no complaints.  Contractions: Irritability. Vag. Bleeding: None.  Movement: Present. Denies leaking of fluid.   The following portions of the patient's history were reviewed and updated as appropriate: allergies, current medications, past family history, past medical history, past social history, past surgical history and problem list. Problem list updated.  Objective:   Vitals:   06/04/16 0926  BP: 104/65  Pulse: 72  Weight: 187 lb (84.8 kg)    Fetal Status: Fetal Heart Rate (bpm): 132 Fundal Height: 37 cm Movement: Present  Presentation: Vertex  General:  Alert, oriented and cooperative. Patient is in no acute distress.  Skin: Skin is warm and dry. No rash noted.   Cardiovascular: Normal heart rate noted  Respiratory: Normal respiratory effort, no problems with respiration noted  Abdomen: Soft, gravid, appropriate for gestational age. Pain/Pressure: Present     Pelvic: Vag. Bleeding: None Vag D/C Character: Thin   Cervical exam deferred        Extremities: Normal range of motion.     Mental Status: Normal mood and affect. Normal behavior. Normal judgment and thought content.   Urinalysis: Urine Protein: Negative Urine Glucose: Negative  Assessment and Plan:  Pregnancy: G1P0 at 5641w5d  1. Supervision of normal first pregnancy, antepartum -planning waterbirth  Term labor symptoms and general obstetric precautions including but not limited to vaginal bleeding, contractions, leaking of fluid and fetal movement were reviewed in detail with the patient. Please refer to After Visit Summary for other counseling recommendations.  Return in about 1 week (around  06/11/2016).   Donette LarryMelanie Zac Torti, CNM

## 2016-06-11 ENCOUNTER — Ambulatory Visit (INDEPENDENT_AMBULATORY_CARE_PROVIDER_SITE_OTHER): Payer: Managed Care, Other (non HMO) | Admitting: Advanced Practice Midwife

## 2016-06-11 DIAGNOSIS — Z3400 Encounter for supervision of normal first pregnancy, unspecified trimester: Secondary | ICD-10-CM

## 2016-06-11 DIAGNOSIS — Z3403 Encounter for supervision of normal first pregnancy, third trimester: Secondary | ICD-10-CM

## 2016-06-11 NOTE — Progress Notes (Signed)
   PRENATAL VISIT NOTE  Subjective:  Patricia Barber is a 25 y.o. G1P0 at 5052w5d being seen today for ongoing prenatal care.  She is currently monitored for the following issues for this low-risk pregnancy and has Supervision of normal pregnancy, antepartum and Late prenatal care affecting pregnancy, antepartum on her problem list.  Patient reports occasional contractions.  Contractions: Irritability. Vag. Bleeding: None.  Movement: Present. Denies leaking of fluid.   The following portions of the patient's history were reviewed and updated as appropriate: allergies, current medications, past family history, past medical history, past social history, past surgical history and problem list. Problem list updated.  Objective:   Vitals:   06/11/16 0852  BP: 97/68  Pulse: 87  Weight: 189 lb (85.7 kg)    Fetal Status: Fetal Heart Rate (bpm): 129 Fundal Height: 40 cm Movement: Present  Presentation: Vertex  General:  Alert, oriented and cooperative. Patient is in no acute distress.  Skin: Skin is warm and dry. No rash noted.   Cardiovascular: Normal heart rate noted  Respiratory: Normal respiratory effort, no problems with respiration noted  Abdomen: Soft, gravid, appropriate for gestational age. Pain/Pressure: Present     Pelvic:  Cervical exam performed Dilation: 1 Effacement (%): 0 Station: Ballotable  Extremities: Normal range of motion.  Edema: Trace  Mental Status: Normal mood and affect. Normal behavior. Normal judgment and thought content.   Assessment and Plan:  Pregnancy: G1P0 at 5752w5d  1. Supervision of normal first pregnancy, antepartum   Term labor symptoms and general obstetric precautions including but not limited to vaginal bleeding, contractions, leaking of fluid and fetal movement were reviewed in detail with the patient. Please refer to After Visit Summary for other counseling recommendations.  Return in about 4 days (around 06/15/2016) for ROB/NST/AFI.  Dorathy KinsmanVirginia  Prospero Mahnke, CNM

## 2016-06-11 NOTE — Patient Instructions (Signed)
Braxton Hicks Contractions °Contractions of the uterus can occur throughout pregnancy. Contractions are not always a sign that you are in labor.  °WHAT ARE BRAXTON HICKS CONTRACTIONS?  °Contractions that occur before labor are called Braxton Hicks contractions, or false labor. Toward the end of pregnancy (32-34 weeks), these contractions can develop more often and may become more forceful. This is not true labor because these contractions do not result in opening (dilatation) and thinning of the cervix. They are sometimes difficult to tell apart from true labor because these contractions can be forceful and people have different pain tolerances. You should not feel embarrassed if you go to the hospital with false labor. Sometimes, the only way to tell if you are in true labor is for your health care provider to look for changes in the cervix. °If there are no prenatal problems or other health problems associated with the pregnancy, it is completely safe to be sent home with false labor and await the onset of true labor. °HOW CAN YOU TELL THE DIFFERENCE BETWEEN TRUE AND FALSE LABOR? °False Labor °· The contractions of false labor are usually shorter and not as hard as those of true labor.   °· The contractions are usually irregular.   °· The contractions are often felt in the front of the lower abdomen and in the groin.   °· The contractions may go away when you walk around or change positions while lying down.   °· The contractions get weaker and are shorter lasting as time goes on.   °· The contractions do not usually become progressively stronger, regular, and closer together as with true labor.   °True Labor °· Contractions in true labor last 30-70 seconds, become very regular, usually become more intense, and increase in frequency.   °· The contractions do not go away with walking.   °· The discomfort is usually felt in the top of the uterus and spreads to the lower abdomen and low back.   °· True labor can be  determined by your health care provider with an exam. This will show that the cervix is dilating and getting thinner.   °WHAT TO REMEMBER °· Keep up with your usual exercises and follow other instructions given by your health care provider.   °· Take medicines as directed by your health care provider.   °· Keep your regular prenatal appointments.   °· Eat and drink lightly if you think you are going into labor.   °· If Braxton Hicks contractions are making you uncomfortable:   °¨ Change your position from lying down or resting to walking, or from walking to resting.   °¨ Sit and rest in a tub of warm water.   °¨ Drink 2-3 glasses of water. Dehydration may cause these contractions.   °¨ Do slow and deep breathing several times an hour.   °WHEN SHOULD I SEEK IMMEDIATE MEDICAL CARE? °Seek immediate medical care if: °· Your contractions become stronger, more regular, and closer together.   °· You have fluid leaking or gushing from your vagina.   °· You have a fever.   °· You pass blood-tinged mucus.   °· You have vaginal bleeding.   °· You have continuous abdominal pain.   °· You have low back pain that you never had before.   °· You feel your baby's head pushing down and causing pelvic pressure.   °· Your baby is not moving as much as it used to.   °  °This information is not intended to replace advice given to you by your health care provider. Make sure you discuss any questions you have with your health care   provider. °  °Document Released: 08/13/2005 Document Revised: 08/18/2013 Document Reviewed: 05/25/2013 °Elsevier Interactive Patient Education ©2016 Elsevier Inc. ° °

## 2016-06-15 ENCOUNTER — Ambulatory Visit (INDEPENDENT_AMBULATORY_CARE_PROVIDER_SITE_OTHER): Payer: Managed Care, Other (non HMO) | Admitting: Advanced Practice Midwife

## 2016-06-15 VITALS — Wt 187.0 lb

## 2016-06-15 DIAGNOSIS — Z3689 Encounter for other specified antenatal screening: Secondary | ICD-10-CM | POA: Diagnosis not present

## 2016-06-15 DIAGNOSIS — Z3483 Encounter for supervision of other normal pregnancy, third trimester: Secondary | ICD-10-CM

## 2016-06-15 DIAGNOSIS — Z3480 Encounter for supervision of other normal pregnancy, unspecified trimester: Secondary | ICD-10-CM

## 2016-06-17 ENCOUNTER — Inpatient Hospital Stay (HOSPITAL_COMMUNITY)
Admission: AD | Admit: 2016-06-17 | Discharge: 2016-06-19 | DRG: 775 | Disposition: A | Discharge status: Home / Self Care | Payer: Managed Care, Other (non HMO) | Source: Ambulatory Visit | Attending: Obstetrics and Gynecology | Admitting: Obstetrics and Gynecology

## 2016-06-17 ENCOUNTER — Encounter (HOSPITAL_COMMUNITY): Payer: Self-pay | Admitting: *Deleted

## 2016-06-17 DIAGNOSIS — Z3A40 40 weeks gestation of pregnancy: Secondary | ICD-10-CM

## 2016-06-17 DIAGNOSIS — Z8249 Family history of ischemic heart disease and other diseases of the circulatory system: Secondary | ICD-10-CM

## 2016-06-17 DIAGNOSIS — Z3490 Encounter for supervision of normal pregnancy, unspecified, unspecified trimester: Secondary | ICD-10-CM

## 2016-06-17 DIAGNOSIS — Z3400 Encounter for supervision of normal first pregnancy, unspecified trimester: Secondary | ICD-10-CM

## 2016-06-17 DIAGNOSIS — O0930 Supervision of pregnancy with insufficient antenatal care, unspecified trimester: Secondary | ICD-10-CM

## 2016-06-17 DIAGNOSIS — Z3403 Encounter for supervision of normal first pregnancy, third trimester: Secondary | ICD-10-CM

## 2016-06-17 DIAGNOSIS — Z823 Family history of stroke: Secondary | ICD-10-CM | POA: Diagnosis not present

## 2016-06-17 DIAGNOSIS — Z3480 Encounter for supervision of other normal pregnancy, unspecified trimester: Secondary | ICD-10-CM

## 2016-06-17 HISTORY — DX: Other specified health status: Z78.9

## 2016-06-17 LAB — CBC
HCT: 33.7 % — ABNORMAL LOW (ref 36.0–46.0)
HEMOGLOBIN: 11.8 g/dL — AB (ref 12.0–15.0)
MCH: 31.7 pg (ref 26.0–34.0)
MCHC: 35 g/dL (ref 30.0–36.0)
MCV: 90.6 fL (ref 78.0–100.0)
PLATELETS: 199 10*3/uL (ref 150–400)
RBC: 3.72 MIL/uL — AB (ref 3.87–5.11)
RDW: 14.3 % (ref 11.5–15.5)
WBC: 10.2 10*3/uL (ref 4.0–10.5)

## 2016-06-17 LAB — TYPE AND SCREEN
ABO/RH(D): A POS
ANTIBODY SCREEN: NEGATIVE

## 2016-06-17 LAB — ABO/RH: ABO/RH(D): A POS

## 2016-06-17 MED ORDER — FENTANYL CITRATE (PF) 100 MCG/2ML IJ SOLN: 100.0000 ug | INTRAMUSCULAR | Status: DC | PRN

## 2016-06-17 MED ORDER — LACTATED RINGERS IV SOLN: INTRAVENOUS | Status: DC

## 2016-06-17 MED ORDER — LACTATED RINGERS IV SOLN: 500.0000 mL | INTRAVENOUS | Status: DC | PRN

## 2016-06-17 MED ORDER — OXYCODONE-ACETAMINOPHEN 5-325 MG PO TABS: 2.0000 | ORAL_TABLET | ORAL | Status: DC | PRN

## 2016-06-17 MED ORDER — OXYCODONE-ACETAMINOPHEN 5-325 MG PO TABS: 1.0000 | ORAL_TABLET | ORAL | Status: DC | PRN

## 2016-06-17 MED ORDER — TERBUTALINE SULFATE 1 MG/ML IJ SOLN
0.2500 mg | Freq: Once | INTRAMUSCULAR | Status: DC | PRN
  Filled 2016-06-17: qty 1

## 2016-06-17 MED ORDER — OXYTOCIN 40 UNITS IN LACTATED RINGERS INFUSION - SIMPLE MED
2.5000 [IU]/h | INTRAVENOUS | Status: DC
  Administered 2016-06-18: 2.5 [IU]/h via INTRAVENOUS
  Filled 2016-06-17: qty 1000

## 2016-06-17 MED ORDER — OXYTOCIN BOLUS FROM INFUSION
500.0000 mL | Freq: Once | INTRAVENOUS | Status: AC
  Administered 2016-06-18: 500 mL via INTRAVENOUS

## 2016-06-17 MED ORDER — ONDANSETRON HCL 4 MG/2ML IJ SOLN: 4.0000 mg | Freq: Four times a day (QID) | INTRAMUSCULAR | Status: DC | PRN

## 2016-06-17 MED ORDER — ACETAMINOPHEN 325 MG PO TABS: 650.0000 mg | ORAL_TABLET | ORAL | Status: DC | PRN

## 2016-06-17 MED ORDER — OXYTOCIN 40 UNITS IN LACTATED RINGERS INFUSION - SIMPLE MED: 1.0000 m[IU]/min | INTRAVENOUS | Status: DC

## 2016-06-17 MED ORDER — LIDOCAINE HCL (PF) 1 % IJ SOLN
30.0000 mL | INTRAMUSCULAR | Status: DC | PRN
  Administered 2016-06-18: 30 mL via SUBCUTANEOUS
  Filled 2016-06-17 (×2): qty 30

## 2016-06-17 MED ORDER — SOD CITRATE-CITRIC ACID 500-334 MG/5ML PO SOLN: 30.0000 mL | ORAL | Status: DC | PRN

## 2016-06-17 NOTE — Progress Notes (Signed)
Patricia Barber is a 25 y.o. G1P0 at 3562w4d by ultrasound admitted for active labor  Subjective:   Objective: BP 107/65   Pulse 79   Temp 97.9 F (36.6 C) (Oral)   Resp 20   Ht 5\' 4"  (1.626 m)   Wt 187 lb (84.8 kg)   BMI 32.10 kg/m  No intake/output data recorded. No intake/output data recorded.  FHT:  FHR: 130's bpm, variability: moderate,  accelerations:  Present,  decelerations:  Absent UC:   irregular SVE:   Dilation: 4 Effacement (%): 80 Station: -2 Exam by:: L. Cresenzo, RNC  Labs: Lab Results  Component Value Date   WBC 10.2 06/17/2016   HGB 11.8 (L) 06/17/2016   HCT 33.7 (L) 06/17/2016   MCV 90.6 06/17/2016   PLT 199 06/17/2016    Assessment / Plan: Early labor   Labor: Not yet in active labor; will intermittent monitor until 2100 then augment with oxytocin  Preeclampsia:  no signs or symptoms of toxicity, intake and ouput balanced and labs stable Fetal Wellbeing:  Category I Pain Control:  Labor support without medications I/D:  n/a Anticipated MOD:  NSVD  Patricia Barber 06/17/2016, 5:52 PM

## 2016-06-17 NOTE — Progress Notes (Signed)
Wonda Ceriseaijla Vanderweele is a 25 y.o. G1P0 at 1829w4d by ultrasound admitted for active labor  Subjective:   Objective: BP 111/69   Pulse 87   Temp 99 F (37.2 C) (Axillary)   Resp 19   Ht 5\' 4"  (1.626 m)   Wt 187 lb (84.8 kg)   BMI 32.10 kg/m  No intake/output data recorded. No intake/output data recorded.  FHT:  FHR: 140 bpm, variability: moderate,  accelerations:  Present,  decelerations:  Absent UC:   regular, every 2-3 minutes SVE:   Dilation: Lip/rim Effacement (%): 100 Station: -1 Exam by:: Rolene Arbouresmaine Lewis, RN  Labs: Lab Results  Component Value Date   WBC 10.2 06/17/2016   HGB 11.8 (L) 06/17/2016   HCT 33.7 (L) 06/17/2016   MCV 90.6 06/17/2016   PLT 199 06/17/2016    Assessment / Plan: Spontaneous labor, progressing normally  Labor: Progressing normally Preeclampsia:  no signs or symptoms of toxicity Fetal Wellbeing:  Category I Pain Control:  Labor support without medications I/D:  n/a Anticipated MOD:  NSVD  Wyvonnia DuskyMarie Geoffry Bannister 06/17/2016, 10:52 PM

## 2016-06-17 NOTE — MAU Note (Signed)
Pt C/O uc's since 0400 yesterday, have become more regular & stronger around 0500 this a.m.  Has had some bloody mucus, no frank bleeding or LOF.

## 2016-06-17 NOTE — H&P (Signed)
Patricia Barber is a 25 y.o. female presenting for early labor. .States having contractions since 0400 yesterday morning. States they have gotten progressively stronger and closer. Denies ROM or vag bleeding. OB History    Gravida Para Term Preterm AB Living   1             SAB TAB Ectopic Multiple Live Births                 Past Medical History:  Diagnosis Date  . Medical history non-contributory    Past Surgical History:  Procedure Laterality Date  . WISDOM TOOTH EXTRACTION     Family History: family history includes Asthma in her mother; Heart disease in her paternal grandfather; Hyperlipidemia in her father and paternal grandfather; Hypertension in her father; Migraines in her brother; Stroke in her paternal grandmother. Social History:  reports that she has never smoked. She has never used smokeless tobacco. She reports that she does not drink alcohol or use drugs.     Maternal Diabetes: No Genetic Screening: Normal Maternal Ultrasounds/Referrals: Normal Fetal Ultrasounds or other Referrals:  None Maternal Substance Abuse:  No Significant Maternal Medications:  None Significant Maternal Lab Results:  None Other Comments:  None  ROS Maternal Medical History:  Reason for admission: Contractions.   Contractions: Onset was 13-24 hours ago.   Frequency: regular.   Perceived severity is moderate.    Fetal activity: Perceived fetal activity is normal.   Last perceived fetal movement was within the past hour.    Prenatal complications: no prenatal complications Prenatal Complications - Diabetes: none.    Dilation: 4 Effacement (%): 80 Station: -2 Exam by:: Dorrene GermanJ. Lowe RN Blood pressure 112/71, pulse 78, temperature 98 F (36.7 C), temperature source Oral, resp. rate 20. Maternal Exam:  Uterine Assessment: Contraction strength is mild.  Contraction frequency is regular.   Abdomen: Patient reports no abdominal tenderness. Fetal presentation: vertex  Introitus: Normal  vulva. Normal vagina.  Ferning test: not done.  Nitrazine test: not done. Amniotic fluid character: not assessed.  Cervix: Cervix evaluated by digital exam.     Fetal Exam Fetal Monitor Review: Variability: moderate (6-25 bpm).   Pattern: accelerations present.    Fetal State Assessment: Category I - tracings are normal.     Physical Exam  Constitutional: She is oriented to person, place, and time. She appears well-developed and well-nourished.  HENT:  Head: Normocephalic.  Eyes: Pupils are equal, round, and reactive to light.  Neck: Normal range of motion.  Cardiovascular: Normal rate, regular rhythm, normal heart sounds and intact distal pulses.   Respiratory: Effort normal and breath sounds normal.  GI: Soft. Bowel sounds are normal.  Genitourinary: Vagina normal and uterus normal.  Musculoskeletal: Normal range of motion.  Neurological: She is alert and oriented to person, place, and time. She has normal reflexes.  Skin: Skin is warm and dry.  Psychiatric: She has a normal mood and affect. Her behavior is normal. Judgment and thought content normal.    Prenatal labs: ABO, Rh: A/Positive/-- (06/12 0000) Antibody: Negative (06/12 0000) Rubella: Nonimmune (06/12 0000) RPR: NON REAC (07/28 0955)  HBsAg: Negative (06/12 0000)  HIV: NONREACTIVE (07/28 0955)  GBS: Negative (09/25 0000)   Assessment/Plan: Desires water birth, SVE 4/80/-2. FHR pattern reassuring. Will admit.   Wyvonnia DuskyMarie Karine Garn 06/17/2016, 1:36 PM

## 2016-06-17 NOTE — Anesthesia Pain Management Evaluation Note (Signed)
  CRNA Pain Management Visit Note  Patient: Patricia Barber, 25 y.o., female  "Hello I am a member of the anesthesia team at [facility]. We have an anesthesia team available at all times to provide care throughout the hospital, including epidural management and anesthesia for C-section. I don't know your plan for the delivery whether it a natural birth, water birth, IV sedation, nitrous supplementation, doula or epidural, but we want to meet your pain goals."   1.Was your pain managed to your expectations on prior hospitalizations?   No prior hospitalizations  2.What is your expectation for pain management during this hospitalization?     Labor support without medications  3.How can we help you reach that goal? Natural   Record the patient's initial score and the patient's pain goal.   Pain: 7  Pain Goal: 10 [facility] wants you to be able to say your pain was always managed very well.  Sheldon Sem 06/17/2016

## 2016-06-18 ENCOUNTER — Encounter (HOSPITAL_COMMUNITY): Payer: Self-pay

## 2016-06-18 LAB — RPR: RPR: NONREACTIVE

## 2016-06-18 MED ORDER — MEASLES, MUMPS & RUBELLA VAC ~~LOC~~ INJ
0.5000 mL | INJECTION | Freq: Once | SUBCUTANEOUS | Status: AC
  Administered 2016-06-19: 0.5 mL via SUBCUTANEOUS
  Filled 2016-06-18 (×2): qty 0.5

## 2016-06-18 MED ORDER — SODIUM CHLORIDE 0.9 % IV SOLN: 250.0000 mL | INTRAVENOUS | Status: DC | PRN

## 2016-06-18 MED ORDER — WITCH HAZEL-GLYCERIN EX PADS: 1.0000 "application " | MEDICATED_PAD | CUTANEOUS | Status: DC | PRN

## 2016-06-18 MED ORDER — ZOLPIDEM TARTRATE 5 MG PO TABS: 5.0000 mg | ORAL_TABLET | Freq: Every evening | ORAL | Status: DC | PRN

## 2016-06-18 MED ORDER — BENZOCAINE-MENTHOL 20-0.5 % EX AERO
1.0000 "application " | INHALATION_SPRAY | CUTANEOUS | Status: DC | PRN
  Administered 2016-06-18: 1 via TOPICAL
  Filled 2016-06-18: qty 56

## 2016-06-18 MED ORDER — DIBUCAINE 1 % RE OINT: 1.0000 "application " | TOPICAL_OINTMENT | RECTAL | Status: DC | PRN

## 2016-06-18 MED ORDER — DIPHENHYDRAMINE HCL 25 MG PO CAPS: 25.0000 mg | ORAL_CAPSULE | Freq: Four times a day (QID) | ORAL | Status: DC | PRN

## 2016-06-18 MED ORDER — TETANUS-DIPHTH-ACELL PERTUSSIS 5-2.5-18.5 LF-MCG/0.5 IM SUSP: 0.5000 mL | Freq: Once | INTRAMUSCULAR | Status: DC

## 2016-06-18 MED ORDER — SODIUM CHLORIDE 0.9% FLUSH: 3.0000 mL | INTRAVENOUS | Status: DC | PRN

## 2016-06-18 MED ORDER — IBUPROFEN 600 MG PO TABS
600.0000 mg | ORAL_TABLET | Freq: Four times a day (QID) | ORAL | Status: DC
  Administered 2016-06-18 – 2016-06-19 (×5): 600 mg via ORAL
  Filled 2016-06-18 (×6): qty 1

## 2016-06-18 MED ORDER — COCONUT OIL OIL: 1.0000 "application " | TOPICAL_OIL | Status: DC | PRN

## 2016-06-18 MED ORDER — SIMETHICONE 80 MG PO CHEW: 80.0000 mg | CHEWABLE_TABLET | ORAL | Status: DC | PRN

## 2016-06-18 MED ORDER — ONDANSETRON HCL 4 MG PO TABS: 4.0000 mg | ORAL_TABLET | ORAL | Status: DC | PRN

## 2016-06-18 MED ORDER — ONDANSETRON HCL 4 MG/2ML IJ SOLN: 4.0000 mg | INTRAMUSCULAR | Status: DC | PRN

## 2016-06-18 MED ORDER — ACETAMINOPHEN 325 MG PO TABS: 650.0000 mg | ORAL_TABLET | ORAL | Status: DC | PRN

## 2016-06-18 MED ORDER — SENNOSIDES-DOCUSATE SODIUM 8.6-50 MG PO TABS
2.0000 | ORAL_TABLET | ORAL | Status: DC
  Administered 2016-06-18 – 2016-06-19 (×2): 2 via ORAL
  Filled 2016-06-18 (×2): qty 2

## 2016-06-18 MED ORDER — SODIUM CHLORIDE 0.9% FLUSH: 3.0000 mL | Freq: Two times a day (BID) | INTRAVENOUS | Status: DC

## 2016-06-18 MED ORDER — PRENATAL MULTIVITAMIN CH
1.0000 | ORAL_TABLET | Freq: Every day | ORAL | Status: DC
  Administered 2016-06-18 – 2016-06-19 (×2): 1 via ORAL
  Filled 2016-06-18 (×2): qty 1

## 2016-06-18 NOTE — Lactation Note (Signed)
This note was copied from a baby's chart. Lactation Consultation Note  Patient Name: Patricia Barber WUJWJ'XToday's Date: 06/18/2016 Reason for consult: Initial assessment Breastfeeding consultation services and support information given and reviewed.  Baby is 5812 hours old and has had a few good feedings.  Baby is skin to skin on mom's chest sleeping.  Mom trying to wake her for a feeding.  Baby removed from mom's chest and waking techniques done.  Baby is gaggy and spit a moderate amount of mucus.  Assisted with positioning baby skin to skin in football hold.  Baby continues to be sleepy and showing no feeding cues.  Reassured parents and discussed normal first 24 hours and then cluster feeds.  Encouraged to call for assist/concerns.  Maternal Data Has patient been taught Hand Expression?: Yes Does the patient have breastfeeding experience prior to this delivery?: No  Feeding Feeding Type: Breast Fed Length of feed: 0 min  LATCH Score/Interventions Latch: Too sleepy or reluctant, no latch achieved, no sucking elicited. Intervention(s): Skin to skin;Teach feeding cues;Waking techniques  Audible Swallowing: None Intervention(s): Skin to skin;Hand expression  Type of Nipple: Flat  Comfort (Breast/Nipple): Soft / non-tender     Hold (Positioning): Assistance needed to correctly position infant at breast and maintain latch. Intervention(s): Breastfeeding basics reviewed;Support Pillows;Position options;Skin to skin  LATCH Score: 4  Lactation Tools Discussed/Used     Consult Status Consult Status: Follow-up Date: 06/19/16 Follow-up type: In-patient    Huston FoleyMOULDEN, Demarkus Remmel S 06/18/2016, 11:30 AM

## 2016-06-18 NOTE — Lactation Note (Signed)
This note was copied from a baby's chart. Lactation Consultation Note  Patient Name: [name] ZOXWR'UToday's Date: 06/18/2016 Reason for consult: Follow-up assessment Mom called out for feeding assessment.  When I entered room she was up in the chair feeding baby using football hold.  It took a few minutes for her to sustain the latch but then did very well with good depth.  Mom using alternate breast massage during feeding and baby staying active.  Encouraged to call with concerns/assist prn.  Maternal Data    Feeding Feeding Type: Breast Fed Length of feed: 0 min  LATCH Score/Interventions Latch: Grasps breast easily, tongue down, lips flanged, rhythmical sucking. Intervention(s): Skin to skin;Teach feeding cues;Waking techniques  Audible Swallowing: Spontaneous and intermittent  Type of Nipple: Flat  Comfort (Breast/Nipple): Soft / non-tender     Hold (Positioning): No assistance needed to correctly position infant at breast. Intervention(s): Breastfeeding basics reviewed;Support Pillows;Position options;Skin to skin  LATCH Score: 9  Lactation Tools Discussed/Used     Consult Status Consult Status: Follow-up Date: 06/19/16 Follow-up type: In-patient    Huston FoleyMOULDEN, Jhayden Demuro S 06/18/2016, 3:23 PM

## 2016-06-18 NOTE — Progress Notes (Signed)
   PRENATAL VISIT NOTE  Subjective:  Patricia Barber is a 25 y.o. G1P1001 at 2469w1d being seen today for ongoing prenatal care.  She is currently monitored for the following issues for this low-risk pregnancy and has Supervision of normal pregnancy, antepartum; Late prenatal care affecting pregnancy, antepartum; and Pregnancy on her problem list.  Patient reports occasional contractions.  Contractions: Irritability. Vag. Bleeding: None.  Movement: Present. Denies leaking of fluid.   The following portions of the patient's history were reviewed and updated as appropriate: allergies, current medications, past family history, past medical history, past social history, past surgical history and problem list. Problem list updated.  Objective:   Vitals:   06/15/16 0920  Weight: 187 lb (84.8 kg)    Fetal Status:     Movement: Present  Presentation: Vertex (17.7 cm)  General:  Alert, oriented and cooperative. Patient is in no acute distress.  Skin: Skin is warm and dry. No rash noted.   Cardiovascular: Normal heart rate noted  Respiratory: Normal respiratory effort, no problems with respiration noted  Abdomen: Soft, gravid, appropriate for gestational age. Pain/Pressure: Present     Pelvic:  Cervical exam performed      1.5 cm  Extremities: Normal range of motion.  Edema: Trace  Mental Status: Normal mood and affect. Normal behavior. Normal judgment and thought content.   Assessment and Plan:  Pregnancy: G1P1001 at 4614w4d  There are no diagnoses linked to this encounter. Term labor symptoms and general obstetric precautions including but not limited to vaginal bleeding, contractions, leaking of fluid and fetal movement were reviewed in detail with the patient. Please refer to After Visit Summary for other counseling recommendations.  NST/AFI 3 days  [place] Misa Fedorko, [place]

## 2016-06-18 NOTE — Progress Notes (Signed)
Post Partum Day 1 Subjective: no complaints, up ad lib, voiding and tolerating PO  Objective: Blood pressure (!) 93/50, pulse 72, temperature 98.2 F (36.8 C), temperature source Oral, resp. rate 18, height 5\' 4"  (1.626 m), weight 187 lb (84.8 kg), unknown if currently breastfeeding.  Physical Exam:  General: alert, cooperative, appears stated age and no distress Lochia: appropriate Uterine Fundus: firm Incision: no dehiscence DVT Evaluation: No evidence of DVT seen on physical exam.   Recent Labs  06/17/16 1410  HGB 11.8*  HCT 33.7*    Assessment/Plan: Plan for discharge tomorrow   LOS: 1 day   Wyvonnia DuskyMarie Lawson 06/18/2016, 5:49 AM

## 2016-06-18 NOTE — Plan of Care (Signed)
Problem: Activity: Goal: Ability to tolerate increased activity will improve Outcome: Completed/Met Date Met: 06/18/16 Pt ambulating independently in room.  Ambulation in hallways encouraged.

## 2016-06-19 MED ORDER — SENNOSIDES-DOCUSATE SODIUM 8.6-50 MG PO TABS: 2.0000 | ORAL_TABLET | ORAL | 2 refills | Status: DC

## 2016-06-19 NOTE — Plan of Care (Signed)
Problem: Health Behavior/Discharge Planning: Goal: Ability to manage health-related needs will improve Outcome: Completed/Met Date Met: 06/19/16 Discharge education dicussed.  Reasons to call MD reviewed.  Pt verbalizes understanding and denies questions.

## 2016-06-19 NOTE — Discharge Summary (Signed)
OB Discharge Summary     Patient Name: Patricia Barber DOB: 09/16/1990 MRN: [id number]  Date of admission: 06/17/2016 Delivering MD: Zerita BoersLAWSON, DARLENE   Date of discharge: 06/19/2016  Admitting diagnosis: 40.5 weeks going into labor Intrauterine pregnancy: 7045w4d     Secondary diagnosis:  Active Problems:   Pregnancy  Additional problems: None     Discharge diagnosis: Term Pregnancy Delivered                                                                                                Post partum procedures:None  Augmentation: None  Complications: None  Hospital course:  Onset of Labor With Vaginal Delivery     25 y.o. yo G1P1001 at 8145w4d was admitted in Latent Labor on 06/17/2016. Patient had an uncomplicated labor course as follows:  Membrane Rupture Time/Date: 11:10 PM ,06/17/2016   Intrapartum Procedures: Episiotomy: None [1]                                         Lacerations:  2nd degree [3];Perineal [11]  Patient had a delivery of a Viable infant. 06/17/2016  Information for the patient's newborn:  Piedad ClimesFaizi, Girl Lequita [[id number]][[id number]]  Delivery Method: Vaginal, Spontaneous Delivery (Filed from Delivery Summary)    Pateint had an uncomplicated postpartum course.  She is ambulating, tolerating a regular diet, passing flatus, and urinating well. Patient is discharged home in stable condition on 06/19/16.    Physical exam  Vitals:   06/18/16 1215 06/18/16 1809 06/19/16 0612 06/19/16 0755  BP: 104/60 117/72 (!) 81/43 102/64  Pulse: 86 84 66   Resp: 18 18 18    Temp:   97.8 F (36.6 C)   TempSrc:   Oral   SpO2: 99%  100%   Weight:      Height:       General: alert, cooperative and no distress Lochia: appropriate Uterine Fundus: firm Incision: N/A DVT Evaluation: No evidence of DVT seen on physical exam. Labs: Lab Results  Component Value Date   WBC 10.2 06/17/2016   HGB 11.8 (L) 06/17/2016   HCT 33.7 (L) 06/17/2016   MCV 90.6 06/17/2016   PLT 199 06/17/2016    No flowsheet data found.  Discharge instruction: per After Visit Summary and "Baby and Me Booklet".  After visit meds:    Medication List    TAKE these medications   prenatal multivitamin Tabs tablet Take 1 tablet by mouth daily.   senna-docusate 8.6-50 MG tablet Commonly known as:  Senokot-S Take 2 tablets by mouth daily.       Diet: routine diet  Activity: Advance as tolerated. Pelvic rest for 6 weeks.   Outpatient follow up:6 weeks Follow up Appt:No future appointments. Follow up Visit:No Follow-up on file.  Postpartum contraception: Undecided  Newborn Data: Live born female  Birth Weight: 6 lb 8.1 oz (2951 g) APGAR: 8, 9  Baby Feeding: Breast Disposition:home with mother   06/19/2016 Deniece ReeJosue D Santos, MD   OB FELLOW DISCHARGE ATTESTATION  I  have seen and examined this patient and agree with above documentation in the resident's note.   Jen MowElizabeth Chester Romero, DO OB Fellow

## 2016-06-19 NOTE — Discharge Instructions (Signed)

## 2016-06-19 NOTE — Lactation Note (Signed)
This note was copied from a baby's chart. Lactation Consultation Note  Baby 7736 hours old.  P1.  Mother states her nipples are sore. She states baby is fussy at the breast and she sometimes has a difficult time latching at first. Her nipples are semi flat.  She has an abrasion and small amount of blood on L nipple. Apply ebm or coconut oil to breasts for soreness and be aware of depth with latching. Suggest mother prepump w/ hand pump prior to latch or hand express to help flow. Mom encouraged to feed baby 8-12 times/24 hours and with feeding cues.  Observed latch in laid back position and baby latched easily. Reviewed engorgement care and monitoring voids/stools. Mom encouraged to feed baby 8-12 times/24 hours and with feeding cues.      Patient Name: Girl Wonda Ceriseaijla Bines ZOXWR'UToday's Date: 06/19/2016 Reason for consult: Follow-up assessment   Maternal Data    Feeding Feeding Type: Breast Fed  LATCH Score/Interventions Latch: Grasps breast easily, tongue down, lips flanged, rhythmical sucking. Intervention(s): Teach feeding cues  Audible Swallowing: A few with stimulation  Type of Nipple: Flat (semi flat)  Comfort (Breast/Nipple): Filling, red/small blisters or bruises, mild/mod discomfort  Problem noted: Mild/Moderate discomfort;Cracked, bleeding, blisters, bruises Interventions  (Cracked/bleeding/bruising/blister): Expressed breast milk to nipple (coconut oil) Interventions (Mild/moderate discomfort): Hand expression;Pre-pump if needed  Hold (Positioning): No assistance needed to correctly position infant at breast.  LATCH Score: 7  Lactation Tools Discussed/Used     Consult Status Consult Status: Complete    Hardie PulleyBerkelhammer, Lesley Galentine Boschen 06/19/2016, 12:04 PM

## 2016-07-31 ENCOUNTER — Ambulatory Visit (INDEPENDENT_AMBULATORY_CARE_PROVIDER_SITE_OTHER): Payer: Managed Care, Other (non HMO) | Admitting: Obstetrics & Gynecology

## 2016-07-31 ENCOUNTER — Encounter: Payer: Self-pay | Admitting: Obstetrics & Gynecology

## 2016-07-31 VITALS — BP 106/67 | HR 91 | Resp 16 | Ht 64.0 in | Wt 170.0 lb

## 2016-07-31 DIAGNOSIS — Z975 Presence of (intrauterine) contraceptive device: Secondary | ICD-10-CM

## 2016-07-31 DIAGNOSIS — Z30017 Encounter for initial prescription of implantable subdermal contraceptive: Secondary | ICD-10-CM

## 2016-07-31 DIAGNOSIS — F53 Postpartum depression: Secondary | ICD-10-CM

## 2016-07-31 DIAGNOSIS — O99345 Other mental disorders complicating the puerperium: Principal | ICD-10-CM

## 2016-07-31 DIAGNOSIS — Z3202 Encounter for pregnancy test, result negative: Secondary | ICD-10-CM

## 2016-07-31 MED ORDER — ETONOGESTREL 68 MG ~~LOC~~ IMPL
68.0000 mg | DRUG_IMPLANT | Freq: Once | SUBCUTANEOUS | Status: AC
  Administered 2016-07-31: 68 mg via SUBCUTANEOUS

## 2016-07-31 NOTE — Progress Notes (Signed)
Post Partum Exam  Patricia Barber is a 25 y.o. 651P1001 female who presents for a postpartum visit. She is 6 weeks postpartum following a spontaneous vaginal delivery. I have fully reviewed the prenatal and intrapartum course. The delivery was at 4769w4d gestational weeks.  Anesthesia: none. Postpartum course has been unremarkable. Baby's course has been unremarkable. Baby is feeding by breast. Bleeding no bleeding. Bowel function is normal. Bladder function is normal. Patient is not sexually active. Contraception method is undecided. Postpartum depression screening:pos  The following portions of the patient's history were reviewed and updated as appropriate: allergies, current medications, past family history, past medical history, past social history, past surgical history and problem list.  Review of Systems Pertinent items noted in HPI and remainder of comprehensive ROS otherwise negative.    Objective:    BP 116/78 mmHg  Pulse 78  Resp 16  Ht 5\' 5"  (1.651 m)  Wt 211 lb (95.709 kg)  BMI 35.11 kg/m2  Breastfeeding? Yes  General:  alert, cooperative and no distress   Breasts:  inspection negative, no nipple discharge or bleeding, no masses or nodularity palpable  Lungs: clear to auscultation bilaterally  Heart:  regular rate and rhythm  Abdomen: soft, non-tender; bowel sounds normal; no masses,  no organomegaly   Vulva:  normal  Vagina: normal vagina  Cervix:  no cervical motion tenderness  Corpus: normal size, contour, position, consistency, mobility, non-tender  Adnexa:  not evaluated  Rectal Exam: Not performed.        Assessment:    Nnml postpartum exam. Pap up to date. Some increased resting one of pelvic diaphragm.    Plan:   1. Contraception: Nexplanon--placed today 2. Wait two weeks to resume intercourse.  Incision healed but a little tender.  If intercourse in painful, would recommend PT.   3. Follow up in: 1 year or as needed.   UPT was negative. Consent was signed.  Time out procedure was performed.  Her left arm was prepped with betadine and infiltrated with 3 cc of 1% lidocaine. After adequate anesthesia was assured, the Nexplanon device was placed between the biceps and triceps approximately 8-10 cm superior to the humeral medial epicondyle.  The Nexplanon was placed superficially by tenting the skin.  Neplanon was palpated in the correct position by myself and patient.  Her arm was hemostatic and was bandaged. Patient tolerated the procedure well and was given appropriate discharge instructions.

## 2016-08-02 ENCOUNTER — Ambulatory Visit (INDEPENDENT_AMBULATORY_CARE_PROVIDER_SITE_OTHER): Payer: Managed Care, Other (non HMO) | Admitting: Clinical

## 2016-08-02 DIAGNOSIS — F4323 Adjustment disorder with mixed anxiety and depressed mood: Secondary | ICD-10-CM

## 2016-08-02 NOTE — BH Specialist Note (Signed)
Session Start time: 1:50   End Time: 2:55 Total Time:  55 minutes Type of Service: Behavioral Health - Individual/Family Interpreter: No.   Interpreter Name & Language: n/a # [facility]   SUBJECTIVE: Patricia Barber is a [age] y.o. female  Pt. was referred by Dr Penne LashLeggett for:  depression. Pt. reports the following symptoms/concerns: Pt states that she sometimes felt mildly depressed prior to pregnancy and birth, but has felt an increase postpartum, including feeling more anxious adjusting to new role as mother; no SI, no HI. Pt has good social support, and has coped with feeling overwhelmed in the past by writing it all out on paper, and keeping track of her daily accomplishments; will begin seeing therapist soon, and will consider BH meds, if needed. Duration of problem:  Less than 6 weeks Severity: moderate Previous treatment: none   OBJECTIVE: Mood: Appropriate & Affect: Appropriate Risk of harm to self or others: No known risk of harm to self or others Assessments administered: none today([place] on 08-01-16, scored 18)  LIFE CONTEXT:  Family & Social: Lives with husband, parents, 3 younger brothers (22,18,15) School/ Work: On maternity leave, goes back to work in January, husband on 16-week paternity leave  Self-Care: Eating well, mild sleep issues (worry prior to sleep), no substances Life changes: Recent childbirth What is important to pt/family (values): Family and overall wellbeing   GOALS ADDRESSED:  -Alleviate symptoms of anxiety and depression  INTERVENTIONS: Solution Focused, Strength-based and Family Systems   ASSESSMENT:  Pt currently experiencing Adjustment disorder with mixed anxious and depressed mood.  Pt may benefit from psychoeducation and brief therapeutic interventions regarding coping with symptoms of anxiety and depression.      PLAN: 1. F/U with behavioral health clinician: As needed 2. Behavioral Health meds: none 3. Behavioral  recommendations:  -Go to therapy appointment, as recommended by medical provider -Read educational material regarding coping with symptoms of anxiety and depression, along with material regarding taking antidepressant medication -Implement Worry Hour strategy, to prioritize life stressors -[facility] 4. Referral: Brief Counseling/Psychotherapy, Publishing rights managerCommunity Resource and Psychoeducation 5. From scale of 1-10, how likely are you to follow plan: 10   Woc-Behavioral Health Clinician  Behavioral Health Clinician  Warmhandoff: no

## 2016-08-06 ENCOUNTER — Telehealth: Payer: Self-pay

## 2016-08-06 MED ORDER — FLUCONAZOLE 150 MG PO TABS: ORAL_TABLET | ORAL | 0 refills | Status: DC

## 2016-08-06 NOTE — Telephone Encounter (Signed)
Patient called stating she went to the pediatrician and they are treating her baby for thrush.  Patient given diflucan to take now and repeat in 48 hours. Patient to return to office if medication does not help. Armandina StammerJennifer Mary Hockey RNBSN

## 2017-04-23 ENCOUNTER — Encounter: Payer: Self-pay | Admitting: Obstetrics & Gynecology

## 2017-04-23 ENCOUNTER — Ambulatory Visit (INDEPENDENT_AMBULATORY_CARE_PROVIDER_SITE_OTHER): Payer: Managed Care, Other (non HMO) | Admitting: Obstetrics & Gynecology

## 2017-04-23 VITALS — BP 108/72 | HR 64 | Resp 16 | Ht 64.0 in | Wt 192.0 lb

## 2017-04-23 DIAGNOSIS — R1032 Left lower quadrant pain: Secondary | ICD-10-CM | POA: Diagnosis not present

## 2017-04-23 NOTE — Progress Notes (Signed)
   Subjective:    Patient ID: Patricia Barber, female    DOB: 03/16/1991, 26 y.o.   MRN: [id number]  HPI 26 yo married Asian P1 (76 month old child) here for follow up after going to Urgent Care about a week ago for a 1 week h/o LLQ pain. She was advised at Urgent Care to try IBU and Tylenol. After taking those, her pain has completely resolved.   Review of Systems She reports a normal pap 2/17. Her Nexplanon was placed at her postpartum visit.    Objective:   Physical Exam Well nourished, well hydrated female, no apparent distress Breathing, conversing, and ambulating normally Abd- benign Bimanual exam reveals no tenderness or masses      Assessment & Plan:  History of LLQ pain- now resolved If the pain returns, then I would rec a gyn u/s

## 2018-04-29 ENCOUNTER — Encounter: Payer: Self-pay | Admitting: Obstetrics & Gynecology

## 2018-04-29 ENCOUNTER — Ambulatory Visit (INDEPENDENT_AMBULATORY_CARE_PROVIDER_SITE_OTHER): Payer: Managed Care, Other (non HMO) | Admitting: Obstetrics & Gynecology

## 2018-04-29 VITALS — BP 123/78 | HR 90 | Ht 64.0 in | Wt 199.0 lb

## 2018-04-29 DIAGNOSIS — Z01419 Encounter for gynecological examination (general) (routine) without abnormal findings: Secondary | ICD-10-CM

## 2018-04-29 NOTE — Progress Notes (Signed)
Patient desires to get pregnant and would like the Nexplanon removed. Nexplanon left arm. Armandina Stammer RN

## 2018-04-29 NOTE — Progress Notes (Signed)
Subjective:     Patricia Barber is a 27 y.o. female here for a routine exam.  Current complaints: wants to become pregnant--remove Nexplanon.  Husband going to [place]--understands to have only protected sex for 3 months after return (cdc guidelines)     Gynecologic History No LMP recorded. Patient has had an implant. Contraception: Nexplanon Last Pap: 2017. Results were: normal Last mammogram: n/a.  Obstetric History OB History  Gravida Para Term Preterm AB Living  1 1 1     1   SAB TAB Ectopic Multiple Live Births        0 1    # Outcome Date GA Lbr Len/2nd Weight Sex Delivery Anes PTL Lv  1 Term 06/17/16 [redacted]w[redacted]d 18:05 / 00:08 6 lb 8.1 oz (2.951 kg) F Vag-Spont Local  LIV     The following portions of the patient's history were reviewed and updated as appropriate: allergies, current medications, past family history, past medical history, past social history, past surgical history and problem list.  Review of Systems Pertinent items noted in HPI and remainder of comprehensive ROS otherwise negative.    Objective:      Vitals:   04/29/18 1416  BP: 123/78  Pulse: 90  Weight: 199 lb (90.3 kg)  Height: 5\' 4"  (1.626 m)   Vitals:  WNL General appearance: alert, cooperative and no distress  HEENT: Normocephalic, without obvious abnormality, atraumatic Eyes: negative Throat: lips, mucosa, and tongue normal; teeth and gums normal  Respiratory: Clear to auscultation bilaterally  CV: Regular rate and rhythm  Breasts:  Normal appearance, no masses or tenderness, no nipple retraction or dimpling  GI: Soft, non-tender; bowel sounds normal; no masses,  no organomegaly  GU: External Genitalia:  Tanner V, no lesion Urethra:  No prolapse   Vagina: Pink, normal rugae, no blood or discharge  Cervix: No CMT, no lesion  Uterus:  Normal size and contour, non tender  Adnexa: Normal, no masses, non tender  Musculoskeletal: No edema, redness or tenderness in the calves or thighs  Skin: No  lesions or rash  Lymphatic: Axillary adenopathy: none     Psychiatric: Normal mood and behavior        Assessment:    Healthy female exam.    Plan:    pap with cotesting--next pap in 3 years.  PNV one month before trying to conceive Protected intercourse 3 months after husbands return from [place] Nexplanon removal next week.

## 2018-04-30 LAB — CYTOLOGY - PAP: DIAGNOSIS: NEGATIVE

## 2018-05-15 ENCOUNTER — Encounter: Payer: Self-pay | Admitting: Obstetrics & Gynecology

## 2018-05-15 ENCOUNTER — Ambulatory Visit (INDEPENDENT_AMBULATORY_CARE_PROVIDER_SITE_OTHER): Payer: Managed Care, Other (non HMO) | Admitting: Obstetrics & Gynecology

## 2018-05-15 VITALS — BP 123/74 | HR 82 | Resp 16 | Ht 64.0 in | Wt 200.0 lb

## 2018-05-15 DIAGNOSIS — Z3046 Encounter for surveillance of implantable subdermal contraceptive: Secondary | ICD-10-CM | POA: Diagnosis not present

## 2018-05-15 NOTE — Progress Notes (Signed)
Wonda Ceriseaijla Golab is a 27 y.o. G1P1001 here for Nexplanon removal. No GYN concerns.  Last pap smear was on April 29, 2018 and was normal.  No other gynecologic concerns.  Nexplanon Removal Patient was given informed consent for removal of her Nexplanon.  Appropriate time out taken. Nexplanon site identified.  Area prepped in usual sterile fashon. One ml of 1% lidocaine was used to anesthetize the area at the distal end of the implant. A small stab incision was made right beside the implant on the distal portion.  The Nexplanon rod was grasped using hemostats and removed without difficulty.  There was minimal blood loss. There were no complications.  A small amount of antibiotic ointment and steri-strips were applied over the small incision.  A pressure bandage was applied to reduce any bruising.  The patient tolerated the procedure well and was given post procedure instructions.  Patient is planning to attempt conception and will start her PNV.  Pt's husband is going to [place] nad understands not to have unprotected intercourse for 3 months after his return.  .Marland Kitchen

## 2018-08-12 ENCOUNTER — Ambulatory Visit: Payer: Self-pay | Admitting: Certified Nurse Midwife

## 2019-03-31 ENCOUNTER — Ambulatory Visit (INDEPENDENT_AMBULATORY_CARE_PROVIDER_SITE_OTHER): Payer: Managed Care, Other (non HMO)

## 2019-03-31 ENCOUNTER — Other Ambulatory Visit: Payer: Self-pay

## 2019-03-31 DIAGNOSIS — Z3201 Encounter for pregnancy test, result positive: Secondary | ICD-10-CM | POA: Diagnosis not present

## 2019-03-31 DIAGNOSIS — Z3200 Encounter for pregnancy test, result unknown: Secondary | ICD-10-CM

## 2019-03-31 LAB — POCT URINE PREGNANCY: Preg Test, Ur: NEGATIVE

## 2019-03-31 NOTE — Progress Notes (Signed)
Pt here for pregnancy verification. UPT positive. Pt has New OB appt scheduled on 04/20/2019.

## 2019-04-10 ENCOUNTER — Encounter (HOSPITAL_COMMUNITY): Payer: Self-pay | Admitting: *Deleted

## 2019-04-10 ENCOUNTER — Telehealth: Payer: Self-pay

## 2019-04-10 ENCOUNTER — Inpatient Hospital Stay (HOSPITAL_COMMUNITY): Payer: Managed Care, Other (non HMO)

## 2019-04-10 ENCOUNTER — Inpatient Hospital Stay (HOSPITAL_COMMUNITY)
Admission: AD | Admit: 2019-04-10 | Discharge: 2019-04-10 | Disposition: A | Discharge status: Home / Self Care | Payer: Managed Care, Other (non HMO) | Attending: Obstetrics & Gynecology | Admitting: Obstetrics & Gynecology

## 2019-04-10 ENCOUNTER — Other Ambulatory Visit: Payer: Self-pay

## 2019-04-10 DIAGNOSIS — O209 Hemorrhage in early pregnancy, unspecified: Secondary | ICD-10-CM | POA: Diagnosis present

## 2019-04-10 DIAGNOSIS — O200 Threatened abortion: Secondary | ICD-10-CM | POA: Diagnosis not present

## 2019-04-10 DIAGNOSIS — Z3A09 9 weeks gestation of pregnancy: Secondary | ICD-10-CM | POA: Insufficient documentation

## 2019-04-10 HISTORY — DX: Headache, unspecified: R51.9

## 2019-04-10 HISTORY — DX: Anxiety disorder, unspecified: F41.9

## 2019-04-10 LAB — WET PREP, GENITAL
Clue Cells Wet Prep HPF POC: NONE SEEN
Sperm: NONE SEEN
Trich, Wet Prep: NONE SEEN
Yeast Wet Prep HPF POC: NONE SEEN

## 2019-04-10 LAB — HCG, QUANTITATIVE, PREGNANCY: hCG, Beta Chain, Quant, S: 2275 m[IU]/mL — ABNORMAL HIGH (ref <5)

## 2019-04-10 LAB — URINALYSIS, ROUTINE W REFLEX MICROSCOPIC
Bilirubin Urine: NEGATIVE
Glucose, UA: NEGATIVE mg/dL
Ketones, ur: NEGATIVE mg/dL
Nitrite: NEGATIVE
Protein, ur: 30 mg/dL — AB
Specific Gravity, Urine: 1.019 (ref 1.005–1.030)
WBC, UA: >50 WBC/hpf — ABNORMAL HIGH (ref 0–5)
pH: 5 (ref 5.0–8.0)

## 2019-04-10 LAB — CBC
HCT: 36.9 % (ref 36.0–46.0)
Hemoglobin: 12.4 g/dL (ref 12.0–15.0)
MCH: 30.7 pg (ref 26.0–34.0)
MCHC: 33.6 g/dL (ref 30.0–36.0)
MCV: 91.3 fL (ref 80.0–100.0)
Platelets: 292 10*3/uL (ref 150–400)
RBC: 4.04 MIL/uL (ref 3.87–5.11)
RDW: 13.6 % (ref 11.5–15.5)
WBC: 8.9 10*3/uL (ref 4.0–10.5)
nRBC: 0 % (ref 0.0–0.2)

## 2019-04-10 LAB — POCT PREGNANCY, URINE: Preg Test, Ur: POSITIVE — AB

## 2019-04-10 NOTE — Discharge Instructions (Signed)
Threatened Miscarriage  A threatened miscarriage occurs when a woman has vaginal bleeding during the first 20 weeks of pregnancy but the pregnancy has not ended. If you have vaginal bleeding during this time, your health care provider will do tests to make sure you are still pregnant. If the tests show that you are still pregnant and that the developing baby (fetus) inside your uterus is still growing, your condition is considered a threatened miscarriage. A threatened miscarriage does not mean your pregnancy will end, but it does increase the risk of losing your pregnancy (complete miscarriage). What are the causes? The cause of this condition is usually not known. For women who go on to have a complete miscarriage, the most common cause is an abnormal number of chromosomes in the developing baby. Chromosomes are the structures inside cells that hold all of a person's genetic material. What increases the risk? The following lifestyle factors may increase your risk of a miscarriage in early pregnancy:  Smoking.  Drinking excessive amounts of alcohol or caffeine.  Recreational drug use. The following preexisting health conditions may increase your risk of a miscarriage in early pregnancy:  Polycystic ovary syndrome.  Uterine fibroids.  Infections.  Diabetes mellitus. What are the signs or symptoms? Symptoms of this condition include:  Vaginal bleeding.  Mild abdominal pain or cramps. How is this diagnosed? If you have bleeding with or without abdominal pain before 20 weeks of pregnancy, your health care provider will do tests to check whether you are still pregnant. These will include:  Ultrasound. This test uses sound waves to create images of the inside of your uterus. This allows your health care provider to look at your developing baby and other structures, such as your placenta.  Pelvic exam. This is an internal exam of your vagina and cervix.  Measurement of your baby's heart  rate.  Laboratory tests such as blood tests, urine tests, or swabs for infection You may be diagnosed with a threatened miscarriage if:  Ultrasound testing shows that you are still pregnant.  Your baby's heart rate is strong.  A pelvic exam shows that the opening between your uterus and your vagina (cervix) is closed.  Blood tests confirm that you are still pregnant. How is this treated? No treatments have been shown to prevent a threatened miscarriage from going on to a complete miscarriage. However, the right home care is important. Follow these instructions at home:  Get plenty of rest.  Do not have sex or use tampons if you have vaginal bleeding.  Do not douche.  Do not smoke or use recreational drugs.  Do not drink alcohol.  Avoid caffeine.  Keep all follow-up prenatal visits as told by your health care provider. This is important. Contact a health care provider if:  You have light vaginal bleeding or spotting while pregnant.  You have abdominal pain or cramping.  You have a fever. Get help right away if:  You have heavy vaginal bleeding.  You have blood clots coming from your vagina.  You pass tissue from your vagina.  You leak fluid, or you have a gush of fluid from your vagina.  You have severe low back pain or abdominal cramps.  You have fever, chills, and severe abdominal pain. Summary  A threatened miscarriage occurs when a woman has vaginal bleeding during the first 20 weeks of pregnancy but the pregnancy has not ended.  The cause of a threatened miscarriage is usually not known.  Symptoms of this condition may   include vaginal bleeding and mild abdominal pain or cramps.  No treatments have been shown to prevent a threatened miscarriage from going on to a complete miscarriage.  Keep all follow-up prenatal visits as told by your health care provider. This is important. This information is not intended to replace advice given to you by your health  care provider. Make sure you discuss any questions you have with your health care provider. Document Released: 08/13/2005 Document Revised: 09/19/2017 Document Reviewed: 11/09/2016 Elsevier Patient Education  2020 Elsevier Inc.  

## 2019-04-10 NOTE — Telephone Encounter (Signed)
Pt with LMP of 02/08/19 called stating that she has been bleeding since last night. Pt told after hours nurse she has also experienced cramping and that bleeding has increased last night. Pt denies recent intercourse. PT states she has been wearing a panty liner and there is blood on it. NOB appt schedule here for 04/20/19. I recommended pt go be seen at the [facility]U. Pt expressed understanding and states she will go to be evaluated.

## 2019-04-10 NOTE — MAU Provider Note (Signed)
Chief Complaint: Vaginal Bleeding   First Provider Initiated Contact with Patient 04/10/19 1143     SUBJECTIVE HPI: Patricia Barber is a 28 y.o. G2P1001 at [redacted]w[redacted]d who presents to Maternity Admissions reporting vaginal bleeding & abdominal cramping that started yesterday. Reports vaginal spotting that has changed colors & is currently dark red. Wearing a panty liner that has had some staining on it, but not saturating pads. Passed a few small clots when she use the bathroom in MAU.   Location: abdomen Quality: cramping Severity: 1/10 on pain scale Duration: 1 day Timing: intermittent Modifying factors: none Associated signs and symptoms: vaginal bleeding  Past Medical History:  Diagnosis Date  . Anxiety   . Headache    OB History  Gravida Para Term Preterm AB Living  2 1 1     1   SAB TAB Ectopic Multiple Live Births        0 1    # Outcome Date GA Lbr Len/2nd Weight Sex Delivery Anes PTL Lv  2 Current           1 Term 06/17/16 [redacted]w[redacted]d 18:05 / 00:08 2951 g F Vag-Spont Local  LIV   Past Surgical History:  Procedure Laterality Date  . WISDOM TOOTH EXTRACTION     Social History   Socioeconomic History  . Marital status: Married    Spouse name: Not on file  . Number of children: Not on file  . Years of education: Not on file  . Highest education level: Not on file  Occupational History  . Not on file  Social Needs  . Financial resource strain: Not on file  . Food insecurity    Worry: Not on file    Inability: Not on file  . Transportation needs    Medical: Not on file    Non-medical: Not on file  Tobacco Use  . Smoking status: Never Smoker  . Smokeless tobacco: Never Used  Substance and Sexual Activity  . Alcohol use: No  . Drug use: No  . Sexual activity: Yes    Birth control/protection: Implant  Lifestyle  . Physical activity    Days per week: Not on file    Minutes per session: Not on file  . Stress: Not on file  Relationships  . Social Herbalist on  phone: Not on file    Gets together: Not on file    Attends religious service: Not on file    Active member of club or organization: Not on file    Attends meetings of clubs or organizations: Not on file    Relationship status: Not on file  . Intimate partner violence    Fear of current or ex partner: Not on file    Emotionally abused: Not on file    Physically abused: Not on file    Forced sexual activity: Not on file  Other Topics Concern  . Not on file  Social History Narrative  . Not on file   Family History  Problem Relation Age of Onset  . Asthma Mother   . Hyperlipidemia Father   . Hypertension Father   . Migraines Brother   . Stroke Paternal Grandmother   . Heart disease Paternal Grandfather   . Hyperlipidemia Paternal Grandfather   . Thyroid disease Neg Hx   . Alzheimer's disease Neg Hx    No current facility-administered medications on file prior to encounter.    Current Outpatient Medications on File Prior to Encounter  Medication Sig  Dispense Refill  . desonide (DESOWEN) 0.05 % cream Apply topically 2 (two) times daily.     Allergies  Allergen Reactions  . Lactose Diarrhea and Nausea And Vomiting    I have reviewed patient's Past Medical Hx, Surgical Hx, Family Hx, Social Hx, medications and allergies.   Review of Systems  Constitutional: Negative.   Gastrointestinal: Positive for abdominal pain.  Genitourinary: Positive for vaginal bleeding.    OBJECTIVE Patient Vitals for the past 24 hrs:  BP Temp Temp src Pulse Resp SpO2 Height Weight  04/10/19 1442 122/69 98.4 F (36.9 C) Oral 78 18 100 % - -  04/10/19 1040 129/76 98.4 F (36.9 C) Oral 85 16 100 % 5\' 4"  (1.626 m) 90.9 kg   Constitutional: Well-developed, well-nourished female in no acute distress.  Cardiovascular: normal rate & rhythm, no murmur Respiratory: normal rate and effort. Lung sounds clear throughout GI: Abd soft, non-tender, Pos BS x 4. No guarding or rebound tenderness MS:  Extremities nontender, no edema, normal ROM Neurologic: Alert and oriented x 4.  GU:     SPECULUM EXAM: NEFG, small amount of dark red blood coming from os. Cervix pink/smooth/not friable  BIMANUAL: No CMT. cervix closed; uterus normal size, no adnexal tenderness or masses.    LAB RESULTS Results for orders placed or performed during the hospital encounter of 04/10/19 (from the past 24 hour(s))  Urinalysis, Routine w reflex microscopic     Status: Abnormal   Collection Time: 04/10/19 10:49 AM  Result Value Ref Range   Color, Urine AMBER (A) YELLOW   APPearance CLOUDY (A) CLEAR   Specific Gravity, Urine 1.019 1.005 - 1.030   pH 5.0 5.0 - 8.0   Glucose, UA NEGATIVE NEGATIVE mg/dL   Hgb urine dipstick LARGE (A) NEGATIVE   Bilirubin Urine NEGATIVE NEGATIVE   Ketones, ur NEGATIVE NEGATIVE mg/dL   Protein, ur 30 (A) NEGATIVE mg/dL   Nitrite NEGATIVE NEGATIVE   Leukocytes,Ua MODERATE (A) NEGATIVE   RBC / HPF 21-50 0 - 5 RBC/hpf   WBC, UA >50 (H) 0 - 5 WBC/hpf   Bacteria, UA RARE (A) NONE SEEN   Squamous Epithelial / LPF 0-5 0 - 5   Mucus PRESENT   Pregnancy, urine POC     Status: Abnormal   Collection Time: 04/10/19 10:58 AM  Result Value Ref Range   Preg Test, Ur POSITIVE (A) NEGATIVE  Wet prep, genital     Status: Abnormal   Collection Time: 04/10/19 12:10 PM   Specimen: Cervical/Vaginal swab  Result Value Ref Range   Yeast Wet Prep HPF POC NONE SEEN NONE SEEN   Trich, Wet Prep NONE SEEN NONE SEEN   Clue Cells Wet Prep HPF POC NONE SEEN NONE SEEN   WBC, Wet Prep HPF POC MODERATE (A) NONE SEEN   Sperm NONE SEEN   CBC     Status: None   Collection Time: 04/10/19 12:52 PM  Result Value Ref Range   WBC 8.9 4.0 - 10.5 K/uL   RBC 4.04 3.87 - 5.11 MIL/uL   Hemoglobin 12.4 12.0 - 15.0 g/dL   HCT 16.136.9 09.636.0 - 04.546.0 %   MCV 91.3 80.0 - 100.0 fL   MCH 30.7 26.0 - 34.0 pg   MCHC 33.6 30.0 - 36.0 g/dL   RDW 40.913.6 81.111.5 - 91.415.5 %   Platelets 292 150 - 400 K/uL   nRBC 0.0 0.0 - 0.2 %  hCG,  quantitative, pregnancy     Status: Abnormal   Collection  Time: 04/10/19 12:52 PM  Result Value Ref Range   hCG, Beta Chain, Quant, S 2,275 (H) <5 mIU/mL    IMAGING [place] Ob Less Than 14 Weeks With Ob Transvaginal  Result Date: 04/10/2019 CLINICAL DATA:  Vaginal bleeding EXAM: OBSTETRIC <14 WK US AND TRANSVAGINAL OB US TECHNIQUE: Both transabdominal and transvaginal ultrasound examinations were performed for complete evaluation of the gestation as well as the maternal uterus, adnexal regions, and pelvic cul-de-sac. Transvaginal technique was performed to assess early pregnancy. COMPARISON:  None. FINDINGS: Intrauterine gestational sac: Single Yolk sac:  Visualized. Embryo:  Visualized. Cardiac Activity: Not Visualized. CRL: 3.4 mm   5 w   6 d                  US EDC: 12/05/2019 Subchorionic hemorrhage:  None visualized. Maternal uterus/adnexae: Bilateral ovaries are within normal limits. The left ovary measures 2.9 x 2 by 1.7 cm. The right ovary measures 3.2 x 3 by 2.5 cm. No significant free fluid. IMPRESSION: Single intrauterine pregnancy with visible embryo, average crown-rump length of 3.4 mm but no cardiac activity identified. Findings are suspicious but not yet definitive for failed pregnancy. Recommend follow-up US in 10-14 days for definitive diagnosis. This recommendation follows SRU consensus guidelines: Diagnostic Criteria for Nonviable Pregnancy Early in the First Trimester. Malva Limes Engl J Med 2013; 010:2725-36; 369:1443-51. Electronically Signed   By: Jasmine PangKim  Fujinaga M.D.   On: 04/10/2019 13:46    MAU COURSE Orders Placed This Encounter  Procedures  . Culture, OB Urine  . Wet prep, genital  . US OB LESS THAN 14 WEEKS WITH OB TRANSVAGINAL  . US OB Transvaginal  . Urinalysis, Routine w reflex microscopic  . CBC  . hCG, quantitative, pregnancy  . Pregnancy, urine POC  . Discharge patient   No orders of the defined types were placed in this encounter.   MDM +UPT UA, wet prep, GC/chlamydia, CBC,  ABO/Rh, quant hCG, and US today to rule out ectopic pregnancy  RH positive  Ultrasound shows IUP with CRL 3.64mm, no cardiac activity.  Discussed results with patient. Concerning for miscarriage but not definitive at this point. Will get future outpatient ultrasound to reassess.   ASSESSMENT 1. Threatened miscarriage   2. Vaginal bleeding in pregnancy, first trimester     PLAN Discharge home in stable condition. SAB precautions Outpatient viability scan ordered for [facility] imaging Msg to [facility] for appt to discuss ultrasound results GC/CT & urine culture pending  Follow-up Information    [facility].   Specialty: Radiology Why: call to schedule follow up ultrasound Contact information: [address], [address] [place] [id number] [phone]       [facility] Follow up.   Specialty: Obstetrics and Gynecology Why: return for worsening symptoms Contact information: [address] [id number] [place] [id number] [phone]         Allergies as of 04/10/2019      Reactions   Lactose Diarrhea, Nausea And Vomiting      Medication List    STOP taking these medications   NEXPLANON [place]     TAKE these medications   desonide 0.05 % cream Commonly known as: DESOWEN Apply topically 2 (two) times daily.        Judeth HornLawrence, Hertha Gergen, NP 04/10/2019  4:12 PM

## 2019-04-10 NOTE — MAU Note (Signed)
Been having some spotting, bleeding since yesterday. Is approx 6wks preg. Little pain when she sits

## 2019-04-11 LAB — GC/CHLAMYDIA PROBE AMP (~~LOC~~) NOT AT ARMC
Chlamydia: NEGATIVE
Neisseria Gonorrhea: NEGATIVE

## 2019-04-12 LAB — CULTURE, OB URINE: Culture: >=100000 — AB

## 2019-04-13 ENCOUNTER — Telehealth: Payer: Self-pay | Admitting: Women's Health

## 2019-04-13 ENCOUNTER — Encounter: Payer: Self-pay | Admitting: Women's Health

## 2019-04-13 ENCOUNTER — Other Ambulatory Visit: Payer: Self-pay | Admitting: Women's Health

## 2019-04-13 DIAGNOSIS — O2340 Unspecified infection of urinary tract in pregnancy, unspecified trimester: Secondary | ICD-10-CM | POA: Insufficient documentation

## 2019-04-13 MED ORDER — CEFADROXIL 500 MG PO CAPS: 500.0000 mg | ORAL_CAPSULE | Freq: Two times a day (BID) | ORAL | 0 refills | Status: AC

## 2019-04-13 NOTE — Telephone Encounter (Signed)
Called and left VM for patient requesting return phone call re: +UTI. This is the first attempt.  Nugent, Gerrie Nordmann, NP  8:30 AM 04/13/2019

## 2019-04-13 NOTE — Telephone Encounter (Signed)
Patient returned phone call. Confirmed patient identity with two identifiers. Patient informed she has  UTI requiring ABX. Pt reports NKDA and is not currently taking any medications other than PNVs. Sending RX to pharmacy.  Clarisa Fling, NP  8:47 AM 04/13/2019

## 2019-04-20 ENCOUNTER — Encounter: Payer: Self-pay | Admitting: Obstetrics & Gynecology

## 2019-04-20 ENCOUNTER — Ambulatory Visit (INDEPENDENT_AMBULATORY_CARE_PROVIDER_SITE_OTHER): Payer: Managed Care, Other (non HMO) | Admitting: Obstetrics & Gynecology

## 2019-04-20 ENCOUNTER — Other Ambulatory Visit: Payer: Managed Care, Other (non HMO)

## 2019-04-20 ENCOUNTER — Other Ambulatory Visit: Payer: Self-pay

## 2019-04-20 VITALS — BP 117/76 | HR 64 | Wt 201.0 lb

## 2019-04-20 DIAGNOSIS — Z6834 Body mass index (BMI) 34.0-34.9, adult: Secondary | ICD-10-CM

## 2019-04-20 DIAGNOSIS — E282 Polycystic ovarian syndrome: Secondary | ICD-10-CM

## 2019-04-20 DIAGNOSIS — E669 Obesity, unspecified: Secondary | ICD-10-CM | POA: Insufficient documentation

## 2019-04-20 DIAGNOSIS — Z23 Encounter for immunization: Secondary | ICD-10-CM

## 2019-04-20 DIAGNOSIS — E6609 Other obesity due to excess calories: Secondary | ICD-10-CM | POA: Diagnosis not present

## 2019-04-20 DIAGNOSIS — O039 Complete or unspecified spontaneous abortion without complication: Secondary | ICD-10-CM | POA: Diagnosis not present

## 2019-04-20 DIAGNOSIS — Z124 Encounter for screening for malignant neoplasm of cervix: Secondary | ICD-10-CM | POA: Diagnosis not present

## 2019-04-20 DIAGNOSIS — Z0000 Encounter for general adult medical examination without abnormal findings: Secondary | ICD-10-CM

## 2019-04-20 DIAGNOSIS — O99210 Obesity complicating pregnancy, unspecified trimester: Secondary | ICD-10-CM | POA: Insufficient documentation

## 2019-04-20 NOTE — Progress Notes (Signed)
Pt started bleeding and cramping on 8/13, bleeding became heavy on 8/15 and pt states she passed tissue. Bleeding stopped 8/20.

## 2019-04-20 NOTE — Progress Notes (Signed)
   Subjective:    Patient ID: Patricia Barber, female    DOB: 1990-09-03, 28 y.o.   MRN: [id number]  HPI 28 yo married P1 here because she just recently had a miscarriage. She is not bleeding now. This was a desired pregnancy. She reports that her period cycle is sometimes q 40 days.   Review of Systems     Objective:   Physical Exam Breathing, conversing, and ambulating normally Bedside u/s showed a trilaminal thickened endometrium, no fetus Ovaries with multiple small follicles c/w PCOS Vulva, vagina, and cervix all normal     Assessment & Plan:  Preventative care- pap smear done today  Flu vaccine offered  Obesity- check HBA1C and rec healthy lifestyle choices  PCOS- check fasting lipids in the future  Desire for pregnancy- rec 3 cycles prior to having unprotected IC, rec continue PNVs daily

## 2019-04-21 LAB — CYTOLOGY - PAP: Diagnosis: NEGATIVE

## 2019-04-21 LAB — HCG, QUANTITATIVE, PREGNANCY: HCG, Total, QN: 12 m[IU]/mL

## 2019-04-21 LAB — HEMOGLOBIN A1C
Hgb A1c MFr Bld: 5.2 % of total Hgb (ref <5.7)
Mean Plasma Glucose: 103 (calc)
eAG (mmol/L): 5.7 (calc)

## 2019-04-27 ENCOUNTER — Ambulatory Visit (INDEPENDENT_AMBULATORY_CARE_PROVIDER_SITE_OTHER): Payer: Managed Care, Other (non HMO)

## 2019-04-27 ENCOUNTER — Other Ambulatory Visit: Payer: Self-pay

## 2019-04-27 DIAGNOSIS — R7989 Other specified abnormal findings of blood chemistry: Secondary | ICD-10-CM

## 2019-04-27 DIAGNOSIS — Z0000 Encounter for general adult medical examination without abnormal findings: Secondary | ICD-10-CM

## 2019-04-27 DIAGNOSIS — E669 Obesity, unspecified: Secondary | ICD-10-CM

## 2019-04-27 DIAGNOSIS — O200 Threatened abortion: Secondary | ICD-10-CM

## 2019-04-27 NOTE — Progress Notes (Signed)
Pt here for blood work only per [employer]. Lab orders given and pt sent to lab.

## 2019-04-28 ENCOUNTER — Other Ambulatory Visit: Payer: Self-pay | Admitting: Obstetrics & Gynecology

## 2019-04-28 LAB — CBC
HCT: 36.9 % (ref 35.0–45.0)
Hemoglobin: 12.3 g/dL (ref 11.7–15.5)
MCH: 30.6 pg (ref 27.0–33.0)
MCHC: 33.3 g/dL (ref 32.0–36.0)
MCV: 91.8 fL (ref 80.0–100.0)
MPV: 10.8 fL (ref 7.5–12.5)
Platelets: 290 10*3/uL (ref 140–400)
RBC: 4.02 10*6/uL (ref 3.80–5.10)
RDW: 13.1 % (ref 11.0–15.0)
WBC: 6.1 10*3/uL (ref 3.8–10.8)

## 2019-04-28 LAB — COMPREHENSIVE METABOLIC PANEL
AG Ratio: 1.4 (calc) (ref 1.0–2.5)
ALT: 18 U/L (ref 6–29)
AST: 20 U/L (ref 10–30)
Albumin: 4.1 g/dL (ref 3.6–5.1)
Alkaline phosphatase (APISO): 50 U/L (ref 31–125)
BUN: 14 mg/dL (ref 7–25)
CO2: 26 mmol/L (ref 20–32)
Calcium: 9.2 mg/dL (ref 8.6–10.2)
Chloride: 105 mmol/L (ref 98–110)
Creat: 0.84 mg/dL (ref 0.50–1.10)
Globulin: 2.9 g/dL (calc) (ref 1.9–3.7)
Glucose, Bld: 86 mg/dL (ref 65–99)
Potassium: 4.1 mmol/L (ref 3.5–5.3)
Sodium: 137 mmol/L (ref 135–146)
Total Bilirubin: 0.5 mg/dL (ref 0.2–1.2)
Total Protein: 7 g/dL (ref 6.1–8.1)

## 2019-04-28 LAB — LIPID PANEL
Cholesterol: 174 mg/dL (ref <200)
HDL: 42 mg/dL — ABNORMAL LOW (ref >=50)
LDL Cholesterol (Calc): 113 mg/dL (calc) — ABNORMAL HIGH
Non-HDL Cholesterol (Calc): 132 mg/dL (calc) — ABNORMAL HIGH (ref <130)
Total CHOL/HDL Ratio: 4.1 (calc) (ref <5.0)
Triglycerides: 87 mg/dL (ref <150)

## 2019-04-28 LAB — HEMOGLOBIN A1C
Hgb A1c MFr Bld: 5.2 % of total Hgb (ref <5.7)
Mean Plasma Glucose: 103 (calc)
eAG (mmol/L): 5.7 (calc)

## 2019-04-28 LAB — HCG, QUANTITATIVE, PREGNANCY: HCG, Total, QN: <3 m[IU]/mL

## 2019-04-28 LAB — VITAMIN D 25 HYDROXY (VIT D DEFICIENCY, FRACTURES): Vit D, 25-Hydroxy: 20 ng/mL — ABNORMAL LOW (ref 30–100)

## 2019-04-28 MED ORDER — VITAMIN D (ERGOCALCIFEROL) 1.25 MG (50000 UNIT) PO CAPS: 50000.0000 [IU] | ORAL_CAPSULE | ORAL | 0 refills | Status: DC

## 2019-04-28 NOTE — Progress Notes (Signed)
Vitamin d ordered for deficiency

## 2019-07-17 ENCOUNTER — Other Ambulatory Visit: Payer: Self-pay | Admitting: Obstetrics & Gynecology

## 2019-08-12 ENCOUNTER — Other Ambulatory Visit: Payer: Self-pay | Admitting: Obstetrics & Gynecology

## 2019-08-12 DIAGNOSIS — E559 Vitamin D deficiency, unspecified: Secondary | ICD-10-CM

## 2019-08-12 NOTE — Progress Notes (Signed)
Vit D level ordered Patient notified via [place].

## 2019-08-28 NOTE — L&D Delivery Note (Signed)
[facility] Delivery Note  Patricia Barber is a 29 y.o. G3P1011 s/p NSVD water birth at [redacted]w[redacted]d. She was admitted for active labor.   ROM: 07/02/2020 at 0738 with clear fluid GBS Status:Positive Maximum Maternal Temperature: 98.8  Labor Progress: . Admitted in active labor. Patient got into the tub, and progressed normally to c/c and pushing   Delivery Date/Time: 07/02/2020 at 0740 Delivery: Called to room and patient was complete and pushing. Head delivered LOA. No nuchal cord present. Shoulder and body delivered in usual fashion. Infant with spontaneous cry, placed on mother's abdomen, dried and stimulated. Cord clamped x 2 after 1-minute delay, and cut by FOB. Cord blood drawn. Tub drained to below the perineum and placenta delivered spontaneously with gentle cord traction. Fundus firm with massage and Pitocin. Labia, perineum, vagina, and cervix inspected inspected with second degree laceration.   Placenta: spontaneous, complete, intact  Complications: None Lacerations: 2nd degree  EBL: 300cc Analgesia: local   Postpartum Planning [X]  message to sent to schedule follow-up  [X]  vaccines: Needs Rubella   Infant: Female  APGARs 8/9  weight pending  DNP, CNM  07/02/20  8:25 AM

## 2019-08-31 DIAGNOSIS — L2089 Other atopic dermatitis: Secondary | ICD-10-CM | POA: Diagnosis not present

## 2019-08-31 DIAGNOSIS — L299 Pruritus, unspecified: Secondary | ICD-10-CM | POA: Diagnosis not present

## 2019-08-31 DIAGNOSIS — L219 Seborrheic dermatitis, unspecified: Secondary | ICD-10-CM | POA: Diagnosis not present

## 2019-10-27 ENCOUNTER — Telehealth: Payer: Self-pay | Admitting: *Deleted

## 2019-10-27 NOTE — Telephone Encounter (Signed)
Returned patient call from 9:26 AM to schedule New OB appointment. No voicemail available to leave a message.

## 2019-11-24 ENCOUNTER — Encounter: Payer: Self-pay | Admitting: *Deleted

## 2019-11-26 ENCOUNTER — Other Ambulatory Visit (HOSPITAL_COMMUNITY)
Admission: RE | Admit: 2019-11-26 | Discharge: 2019-11-26 | Disposition: A | Discharge status: Home / Self Care | Payer: BC Managed Care – PPO | Source: Ambulatory Visit | Attending: Obstetrics & Gynecology | Admitting: Obstetrics & Gynecology

## 2019-11-26 ENCOUNTER — Encounter: Payer: Self-pay | Admitting: Obstetrics & Gynecology

## 2019-11-26 ENCOUNTER — Ambulatory Visit (INDEPENDENT_AMBULATORY_CARE_PROVIDER_SITE_OTHER): Payer: BC Managed Care – PPO | Admitting: Obstetrics & Gynecology

## 2019-11-26 ENCOUNTER — Other Ambulatory Visit: Payer: Self-pay

## 2019-11-26 VITALS — BP 118/81 | HR 86 | Temp 98.4°F | Wt 197.0 lb

## 2019-11-26 DIAGNOSIS — O26891 Other specified pregnancy related conditions, first trimester: Secondary | ICD-10-CM

## 2019-11-26 DIAGNOSIS — Z3A09 9 weeks gestation of pregnancy: Secondary | ICD-10-CM

## 2019-11-26 DIAGNOSIS — R519 Headache, unspecified: Secondary | ICD-10-CM

## 2019-11-26 DIAGNOSIS — Z3480 Encounter for supervision of other normal pregnancy, unspecified trimester: Secondary | ICD-10-CM

## 2019-11-26 DIAGNOSIS — O26899 Other specified pregnancy related conditions, unspecified trimester: Secondary | ICD-10-CM

## 2019-11-26 NOTE — Addendum Note (Signed)
Addended by: Granville Lewis on: 11/26/2019 03:59 PM   Modules accepted: Orders

## 2019-11-26 NOTE — Progress Notes (Signed)
Bedside U/S shows single IUP with FHT of 173 BPM and CRL measures 22.64 mm  GA 9w

## 2019-11-26 NOTE — Progress Notes (Signed)
  Subjective:    Patricia Barber is being seen today for her first obstetrical visit.G1P1001 LMP 09/16/2019  This is a planned pregnancy. She is at [redacted]w[redacted]d gestation. Her obstetrical history is significant for PCOS prior ot pregnancy. On Metformin.  . Relationship with FOB: spouse, living together. Patient does intend to breast feed. Pregnancy history fully reviewed.  Patient reports occ naueas. No emesis..  Review of Systems:   Review of Systems  Objective:     BP 118/81   Pulse 86   Temp 98.4 F (36.9 C)   Wt 197 lb (89.4 kg)   LMP 09/16/2019   BMI 33.81 kg/m  Physical Exam  Exam    Assessment:    Pregnancy: G3P1001 Patient Active Problem List   Diagnosis Date Noted  . Maternal obesity, antepartum 04/20/2019  . PCOS (polycystic ovarian syndrome) 04/20/2019  . Supervision of other normal pregnancy, antepartum 06/17/2016       Plan:     Initial labs drawn. Prenatal vitamins. Problem list reviewed and updated. AFP3 discussed: requested. Role of ultrasound in pregnancy discussed; fetal survey: requested. Amniocentesis discussed: not indicated. Follow up in 4 weeks. 60% of 30 min visit spent on counseling and coordination of care.  Check Vit D  HGbA1C.  Discontinue Metformin  Willodean Rosenthal 11/26/2019

## 2019-11-27 LAB — OBSTETRIC PANEL
Absolute Monocytes: 452 cells/uL (ref 200–950)
Antibody Screen: NOT DETECTED
Basophils Absolute: 17 cells/uL (ref 0–200)
Basophils Relative: 0.2 %
Eosinophils Absolute: 9 cells/uL — ABNORMAL LOW (ref 15–500)
Eosinophils Relative: 0.1 %
HCT: 39.5 % (ref 35.0–45.0)
Hemoglobin: 13 g/dL (ref 11.7–15.5)
Hepatitis B Surface Ag: NONREACTIVE
Lymphs Abs: 2958 cells/uL (ref 850–3900)
MCH: 30.6 pg (ref 27.0–33.0)
MCHC: 32.9 g/dL (ref 32.0–36.0)
MCV: 92.9 fL (ref 80.0–100.0)
MPV: 11 fL (ref 7.5–12.5)
Monocytes Relative: 5.2 %
Neutro Abs: 5264 cells/uL (ref 1500–7800)
Neutrophils Relative %: 60.5 %
Platelets: 335 10*3/uL (ref 140–400)
RBC: 4.25 10*6/uL (ref 3.80–5.10)
RDW: 12.4 % (ref 11.0–15.0)
RPR Ser Ql: NONREACTIVE
Rubella: <0.9 Index — ABNORMAL LOW
Total Lymphocyte: 34 %
WBC: 8.7 10*3/uL (ref 3.8–10.8)

## 2019-11-27 LAB — GC/CHLAMYDIA PROBE AMP (~~LOC~~) NOT AT ARMC
Chlamydia: NEGATIVE
Comment: NEGATIVE
Comment: NORMAL
Neisseria Gonorrhea: NEGATIVE

## 2019-11-27 LAB — HEPATITIS C ANTIBODY
Hepatitis C Ab: NONREACTIVE
SIGNAL TO CUT-OFF: 0.01 (ref <1.00)

## 2019-11-27 LAB — HIV ANTIBODY (ROUTINE TESTING W REFLEX): HIV 1&2 Ab, 4th Generation: NONREACTIVE

## 2019-11-27 LAB — HEMOGLOBIN A1C
Hgb A1c MFr Bld: 5.1 % of total Hgb (ref <5.7)
Mean Plasma Glucose: 100 (calc)
eAG (mmol/L): 5.5 (calc)

## 2019-11-27 LAB — VITAMIN D 25 HYDROXY (VIT D DEFICIENCY, FRACTURES): Vit D, 25-Hydroxy: 55 ng/mL (ref 30–100)

## 2019-11-28 LAB — CULTURE, OB URINE

## 2019-11-28 LAB — URINE CULTURE, OB REFLEX

## 2019-12-03 ENCOUNTER — Encounter: Payer: Self-pay | Admitting: Obstetrics & Gynecology

## 2019-12-03 DIAGNOSIS — Z283 Underimmunization status: Secondary | ICD-10-CM | POA: Insufficient documentation

## 2019-12-03 DIAGNOSIS — O09899 Supervision of other high risk pregnancies, unspecified trimester: Secondary | ICD-10-CM | POA: Insufficient documentation

## 2019-12-03 DIAGNOSIS — O99891 Other specified diseases and conditions complicating pregnancy: Secondary | ICD-10-CM | POA: Insufficient documentation

## 2019-12-14 ENCOUNTER — Encounter: Payer: Self-pay | Admitting: Obstetrics & Gynecology

## 2019-12-24 ENCOUNTER — Other Ambulatory Visit: Payer: Self-pay

## 2019-12-24 ENCOUNTER — Ambulatory Visit (INDEPENDENT_AMBULATORY_CARE_PROVIDER_SITE_OTHER): Payer: BC Managed Care – PPO | Admitting: Obstetrics & Gynecology

## 2019-12-24 VITALS — BP 122/74 | HR 76 | Temp 98.3°F | Wt 192.0 lb

## 2019-12-24 DIAGNOSIS — Z3A13 13 weeks gestation of pregnancy: Secondary | ICD-10-CM

## 2019-12-24 DIAGNOSIS — O99211 Obesity complicating pregnancy, first trimester: Secondary | ICD-10-CM | POA: Diagnosis not present

## 2019-12-24 DIAGNOSIS — R519 Headache, unspecified: Secondary | ICD-10-CM | POA: Diagnosis not present

## 2019-12-24 DIAGNOSIS — O99891 Other specified diseases and conditions complicating pregnancy: Secondary | ICD-10-CM

## 2019-12-24 DIAGNOSIS — O26891 Other specified pregnancy related conditions, first trimester: Secondary | ICD-10-CM | POA: Diagnosis not present

## 2019-12-24 DIAGNOSIS — O26899 Other specified pregnancy related conditions, unspecified trimester: Secondary | ICD-10-CM

## 2019-12-24 DIAGNOSIS — Z3480 Encounter for supervision of other normal pregnancy, unspecified trimester: Secondary | ICD-10-CM

## 2019-12-24 DIAGNOSIS — Z2839 Other underimmunization status: Secondary | ICD-10-CM

## 2019-12-24 DIAGNOSIS — O99210 Obesity complicating pregnancy, unspecified trimester: Secondary | ICD-10-CM

## 2019-12-24 DIAGNOSIS — Z283 Underimmunization status: Secondary | ICD-10-CM

## 2019-12-24 NOTE — Progress Notes (Signed)
   PRENATAL VISIT NOTE  Subjective:  Patricia Barber is a 29 y.o. G3P1001 at 87w0dbeing seen today for ongoing prenatal care.  She is currently monitored for the following issues for this low-risk pregnancy and has Supervision of other normal pregnancy, antepartum; Maternal obesity, antepartum; PCOS (polycystic ovarian syndrome); Headache in pregnancy, antepartum; and Rubella non-immune status, antepartum on their problem list.  Patient reports mild mid pelvic pain. .  Contractions: Not present. Vag. Bleeding: None.  Movement: Absent. Denies leaking of fluid.   The following portions of the patient's history were reviewed and updated as appropriate: allergies, current medications, past family history, past medical history, past social history, past surgical history and problem list.   Objective:   Vitals:   12/24/19 1502  BP: 122/74  Pulse: 76  Temp: 98.3 F (36.8 C)  Weight: 192 lb (87.1 kg)    Fetal Status: Fetal Heart Rate (bpm): 156   Movement: Absent     General:  Alert, oriented and cooperative. Patient is in no acute distress.  Skin: Skin is warm and dry. No rash noted.   Cardiovascular: Normal heart rate noted  Respiratory: Normal respiratory effort, no problems with respiration noted  Abdomen: Soft, gravid, appropriate for gestational age.  Pain/Pressure: Absent     Pelvic: Cervical exam deferred        Extremities: Normal range of motion.  Edema: None  Mental Status: Normal mood and affect. Normal behavior. Normal judgment and thought content.   Assessment and Plan:  Pregnancy: G3P1001 at 141w0d. Supervision of other normal pregnancy, antepartum Abd exam- WNL  NIPTs sent today   2. Maternal obesity, antepartum  3. Headache in pregnancy, antepartum resolved  4. Rubella non-immune status, antepartum Needs MMR PP  Preterm labor symptoms and general obstetric precautions including but not limited to vaginal bleeding, contractions, leaking of fluid and fetal movement  were reviewed in detail with the patient. Please refer to After Visit Summary for other counseling recommendations.   Return in about 4 weeks (around 01/21/2020).  No future appointments.  CaLavonia DraftsMD

## 2019-12-26 DIAGNOSIS — Z20822 Contact with and (suspected) exposure to covid-19: Secondary | ICD-10-CM | POA: Diagnosis not present

## 2019-12-26 DIAGNOSIS — B349 Viral infection, unspecified: Secondary | ICD-10-CM | POA: Diagnosis not present

## 2019-12-26 DIAGNOSIS — J029 Acute pharyngitis, unspecified: Secondary | ICD-10-CM | POA: Diagnosis not present

## 2020-01-02 DIAGNOSIS — J0100 Acute maxillary sinusitis, unspecified: Secondary | ICD-10-CM | POA: Diagnosis not present

## 2020-01-14 DIAGNOSIS — F331 Major depressive disorder, recurrent, moderate: Secondary | ICD-10-CM | POA: Diagnosis not present

## 2020-02-08 ENCOUNTER — Other Ambulatory Visit: Payer: BC Managed Care – PPO

## 2020-02-09 ENCOUNTER — Encounter: Payer: BC Managed Care – PPO | Admitting: Advanced Practice Midwife

## 2020-02-09 ENCOUNTER — Other Ambulatory Visit: Payer: BC Managed Care – PPO

## 2020-02-10 ENCOUNTER — Other Ambulatory Visit: Payer: Self-pay

## 2020-02-10 ENCOUNTER — Other Ambulatory Visit: Payer: Self-pay | Admitting: Obstetrics & Gynecology

## 2020-02-10 ENCOUNTER — Ambulatory Visit: Payer: BC Managed Care – PPO | Attending: Obstetrics & Gynecology

## 2020-02-10 DIAGNOSIS — E669 Obesity, unspecified: Secondary | ICD-10-CM

## 2020-02-10 DIAGNOSIS — Z3480 Encounter for supervision of other normal pregnancy, unspecified trimester: Secondary | ICD-10-CM

## 2020-02-10 DIAGNOSIS — O99212 Obesity complicating pregnancy, second trimester: Secondary | ICD-10-CM

## 2020-02-10 DIAGNOSIS — Z3A19 19 weeks gestation of pregnancy: Secondary | ICD-10-CM

## 2020-02-10 DIAGNOSIS — Z363 Encounter for antenatal screening for malformations: Secondary | ICD-10-CM

## 2020-02-12 ENCOUNTER — Encounter: Payer: BC Managed Care – PPO | Admitting: Obstetrics and Gynecology

## 2020-02-16 ENCOUNTER — Ambulatory Visit (INDEPENDENT_AMBULATORY_CARE_PROVIDER_SITE_OTHER): Payer: BC Managed Care – PPO | Admitting: Obstetrics and Gynecology

## 2020-02-16 ENCOUNTER — Other Ambulatory Visit: Payer: Self-pay

## 2020-02-16 VITALS — BP 102/66 | HR 73 | Wt 194.0 lb

## 2020-02-16 DIAGNOSIS — Z3482 Encounter for supervision of other normal pregnancy, second trimester: Secondary | ICD-10-CM

## 2020-02-16 DIAGNOSIS — Z3A20 20 weeks gestation of pregnancy: Secondary | ICD-10-CM

## 2020-02-16 DIAGNOSIS — Z3480 Encounter for supervision of other normal pregnancy, unspecified trimester: Secondary | ICD-10-CM

## 2020-02-16 NOTE — Progress Notes (Signed)
   PRENATAL VISIT NOTE  Subjective:  Patricia Barber is a 29 y.o. G3P1001 at [redacted]w[redacted]d being seen today for ongoing prenatal care.  She is currently monitored for the following issues for this low-risk pregnancy and has Supervision of other normal pregnancy, antepartum; Maternal obesity, antepartum; PCOS (polycystic ovarian syndrome); Headache in pregnancy, antepartum; and Rubella non-immune status, antepartum on their problem list.  Patient reports no complaints.  Contractions: Not present. Vag. Bleeding: None.  Movement: Present. Denies leaking of fluid.   The following portions of the patient's history were reviewed and updated as appropriate: allergies, current medications, past family history, past medical history, past social history, past surgical history and problem list.   Objective:   Vitals:   02/16/20 0821  BP: 102/66  Pulse: 73  Weight: 194 lb (88 kg)    Fetal Status: Fetal Heart Rate (bpm): 146 Fundal Height: 22 cm Movement: Present     General:  Alert, oriented and cooperative. Patient is in no acute distress.  Skin: Skin is warm and dry. No rash noted.   Cardiovascular: Normal heart rate noted  Respiratory: Normal respiratory effort, no problems with respiration noted  Abdomen: Soft, gravid, appropriate for gestational age.  Pain/Pressure: Absent     Pelvic: Cervical exam deferred        Extremities: Normal range of motion.  Edema: None  Mental Status: Normal mood and affect. Normal behavior. Normal judgment and thought content.   Assessment and Plan:  Pregnancy: G3P1001 at [redacted]w[redacted]d 1. Supervision of other normal pregnancy, antepartum  - AFP, Serum, Open Spina Bifida - Doing well - Reviewed water birth course and [facility]; water birth class.    Preterm labor symptoms and general obstetric precautions including but not limited to vaginal bleeding, contractions, leaking of fluid and fetal movement were reviewed in detail with the patient. Please refer to After  Visit Summary for other counseling recommendations.   Return in about 4 weeks (around 03/15/2020) for CNM visit.  No future appointments.  Venia Carbon, NP

## 2020-02-16 NOTE — Patient Instructions (Signed)
Considering Waterbirth? °Guide for patients at [facility] °Why consider waterbirth? °• Gentle birth for babies  °• Less pain medicine used in labor  °• May allow for passive descent/less pushing  °• May reduce perineal tears  °• More mobility and instinctive maternal position changes  °• Increased maternal relaxation  °• Reduced blood pressure in labor  ° °Is waterbirth safe? What are the risks of infection, drowning or other complications? °• Infection:  °• Very low risk (3.7 % for tub vs 4.8% for bed)  °• 7 in 8000 waterbirths with documented infection  °• Poorly cleaned equipment most common cause  °• Slightly lower group B strep transmission rate  °• Drowning  °• Maternal:  °• Very low risk  °• Related to seizures or fainting  °• Newborn:  °• Very low risk. No evidence of increased risk of respiratory problems in multiple large studies  °• Physiological protection from breathing under water  °• Avoid underwater birth if there are any fetal complications  °• Once baby's head is out of the water, keep it out.  °• Birth complication  °• Some reports of cord trauma, but risk decreased by bringing baby to surface gradually  °• No evidence of increased risk of shoulder dystocia. Mothers can usually change positions faster in water than in a bed, possibly aiding the maneuvers to free the shoulder.  °? °You must attend a Waterbirth class at [facility] [place] °• 3rd Wednesday of every month from 7-9pm  °• Free  °• Register by calling [phone] or online at [email or web address]  °• Bring us the certificate from the class to your prenatal appointment  °Meet with a midwife at 36 weeks to see if you can still plan a waterbirth and to sign the consent.  ° °If you plan a waterbirth at [facility] [place], you can opt to purchase the following: °• Fish Net °• Bathing suit top (optional)  °• Long-handled mirror (optional)  °•  °Things that would  prevent you from having a waterbirth: °• Unknown or Positive COVID-19 diagnosis upon admission to hospital  °• Premature, <37wks  °• Previous cesarean birth  °• Presence of thick meconium-stained fluid  °• Multiple gestation (Twins, triplets, etc.)  °• Uncontrolled diabetes or gestational diabetes requiring medication  °• Hypertension requiring medication or diagnosis of pre-eclampsia  °• Heavy vaginal bleeding  °• Non-reassuring fetal heart rate  °• Active infection (MRSA, etc.). Group B Strep is NOT a contraindication for waterbirth.  °• If your labor has to be induced and induction method requires continuous monitoring of the baby's heart rate  °• Other risks/issues identified by your obstetrical provider  °Please remember that birth is unpredictable. Under certain unforeseeable circumstances your provider may advise against giving birth in the tub. These decisions will be made on a case-by-case basis and with the safety of you and your baby as our highest priority. ° °**Please remember that in order to have a waterbirth, you must test Negative to COVID-19 upon admission to the hospital.** ° °

## 2020-02-23 LAB — AFP, SERUM, OPEN SPINA BIFIDA
AFP MoM: 1.36
AFP Value: 67.5 ng/mL
Gest. Age on Collection Date: 20 weeks
Maternal Age At EDD: 29.3 yr
OSBR Risk 1 IN: 4034
Test Results:: NEGATIVE
Weight: 194 [lb_av]

## 2020-03-22 ENCOUNTER — Other Ambulatory Visit: Payer: Self-pay

## 2020-03-22 ENCOUNTER — Ambulatory Visit (INDEPENDENT_AMBULATORY_CARE_PROVIDER_SITE_OTHER): Payer: BC Managed Care – PPO | Admitting: Advanced Practice Midwife

## 2020-03-22 VITALS — BP 99/69 | HR 86 | Wt 199.0 lb

## 2020-03-22 DIAGNOSIS — O26892 Other specified pregnancy related conditions, second trimester: Secondary | ICD-10-CM

## 2020-03-22 DIAGNOSIS — Z3A26 26 weeks gestation of pregnancy: Secondary | ICD-10-CM

## 2020-03-22 DIAGNOSIS — Z3482 Encounter for supervision of other normal pregnancy, second trimester: Secondary | ICD-10-CM

## 2020-03-22 DIAGNOSIS — Z3480 Encounter for supervision of other normal pregnancy, unspecified trimester: Secondary | ICD-10-CM

## 2020-03-22 DIAGNOSIS — R12 Heartburn: Secondary | ICD-10-CM

## 2020-03-22 MED ORDER — FAMOTIDINE 20 MG PO TABS: 20.0000 mg | ORAL_TABLET | Freq: Two times a day (BID) | ORAL | 2 refills | Status: DC | PRN

## 2020-03-22 NOTE — Progress Notes (Signed)
Pt c/o heartburn 

## 2020-03-22 NOTE — Progress Notes (Signed)
   PRENATAL VISIT NOTE  Subjective:  Patricia Barber is a 29 y.o. G3P1001 at [redacted]w[redacted]d being seen today for ongoing prenatal care.  She is currently monitored for the following issues for this low-risk pregnancy and has Supervision of other normal pregnancy, antepartum; Maternal obesity, antepartum; PCOS (polycystic ovarian syndrome); Headache in pregnancy, antepartum; and Rubella non-immune status, antepartum on their problem list.  Patient reports heartburn.  Contractions: Not present. Vag. Bleeding: None.  Movement: Present. Denies leaking of fluid.   The following portions of the patient's history were reviewed and updated as appropriate: allergies, current medications, past family history, past medical history, past social history, past surgical history and problem list.   Objective:   Vitals:   03/22/20 1449  BP: 99/69  Pulse: 86  Weight: 199 lb (90.3 kg)    Fetal Status: Fetal Heart Rate (bpm): 141   Movement: Present     General:  Alert, oriented and cooperative. Patient is in no acute distress.  Skin: Skin is warm and dry. No rash noted.   Cardiovascular: Normal heart rate noted  Respiratory: Normal respiratory effort, no problems with respiration noted  Abdomen: Soft, gravid, appropriate for gestational age.  Pain/Pressure: Absent     Pelvic: Cervical exam deferred        Extremities: Normal range of motion.  Edema: None  Mental Status: Normal mood and affect. Normal behavior. Normal judgment and thought content.   Assessment and Plan:  Pregnancy: G3P1001 at [redacted]w[redacted]d 1. Heartburn during pregnancy in second trimester --Took Zantac with first pregnancy --Rx for Pepcid 20 mg BID PRN --Discussed dietary changes to reduce heartburn  2. Supervision of other normal pregnancy, antepartum --Anticipatory guidance about next visits/weeks of pregnancy given. --Next visit in 3 weeks for GTT  Preterm labor symptoms and general obstetric precautions including but not limited to vaginal  bleeding, contractions, leaking of fluid and fetal movement were reviewed in detail with the patient. Please refer to After Visit Summary for other counseling recommendations.   No follow-ups on file.  Future Appointments  Date Time Provider Department Center  04/11/2020  8:30 AM Conan Bowens, MD [facility]    Sharen Counter, CNM

## 2020-04-05 DIAGNOSIS — F331 Major depressive disorder, recurrent, moderate: Secondary | ICD-10-CM | POA: Diagnosis not present

## 2020-04-11 ENCOUNTER — Other Ambulatory Visit: Payer: Self-pay

## 2020-04-11 ENCOUNTER — Encounter: Payer: Self-pay | Admitting: Obstetrics and Gynecology

## 2020-04-11 ENCOUNTER — Inpatient Hospital Stay (HOSPITAL_COMMUNITY)
Admission: AD | Admit: 2020-04-11 | Discharge: 2020-04-11 | Disposition: A | Discharge status: Home / Self Care | Payer: BC Managed Care – PPO | Attending: Obstetrics and Gynecology | Admitting: Obstetrics and Gynecology

## 2020-04-11 ENCOUNTER — Ambulatory Visit (INDEPENDENT_AMBULATORY_CARE_PROVIDER_SITE_OTHER): Payer: BC Managed Care – PPO | Admitting: Obstetrics and Gynecology

## 2020-04-11 ENCOUNTER — Encounter (HOSPITAL_COMMUNITY): Payer: Self-pay | Admitting: Obstetrics and Gynecology

## 2020-04-11 VITALS — BP 103/68 | HR 71 | Wt 199.0 lb

## 2020-04-11 DIAGNOSIS — Y92410 Unspecified street and highway as the place of occurrence of the external cause: Secondary | ICD-10-CM | POA: Diagnosis not present

## 2020-04-11 DIAGNOSIS — O26893 Other specified pregnancy related conditions, third trimester: Secondary | ICD-10-CM

## 2020-04-11 DIAGNOSIS — Z283 Underimmunization status: Secondary | ICD-10-CM

## 2020-04-11 DIAGNOSIS — O368130 Decreased fetal movements, third trimester, not applicable or unspecified: Secondary | ICD-10-CM | POA: Diagnosis not present

## 2020-04-11 DIAGNOSIS — Z2839 Other underimmunization status: Secondary | ICD-10-CM

## 2020-04-11 DIAGNOSIS — Z3480 Encounter for supervision of other normal pregnancy, unspecified trimester: Secondary | ICD-10-CM

## 2020-04-11 DIAGNOSIS — O9A213 Injury, poisoning and certain other consequences of external causes complicating pregnancy, third trimester: Secondary | ICD-10-CM | POA: Diagnosis not present

## 2020-04-11 DIAGNOSIS — M542 Cervicalgia: Secondary | ICD-10-CM | POA: Diagnosis not present

## 2020-04-11 DIAGNOSIS — Z23 Encounter for immunization: Secondary | ICD-10-CM | POA: Diagnosis not present

## 2020-04-11 DIAGNOSIS — Z3A28 28 weeks gestation of pregnancy: Secondary | ICD-10-CM | POA: Insufficient documentation

## 2020-04-11 DIAGNOSIS — O09899 Supervision of other high risk pregnancies, unspecified trimester: Secondary | ICD-10-CM

## 2020-04-11 DIAGNOSIS — O99891 Other specified diseases and conditions complicating pregnancy: Secondary | ICD-10-CM

## 2020-04-11 DIAGNOSIS — O26899 Other specified pregnancy related conditions, unspecified trimester: Secondary | ICD-10-CM
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
## 2020-04-11 NOTE — Patient Instructions (Signed)
[facility] [address] [place],  [place]  [address] Main: [phone] 

## 2020-04-11 NOTE — Discharge Instructions (Signed)
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
## 2020-04-11 NOTE — Progress Notes (Signed)
Pt chose to finish GTT here at [facility] lab in [place] and states she will go to MAU after her final blood draw
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
## 2020-04-12 LAB — CBC
HCT: 32.1 % — ABNORMAL LOW (ref 35.0–45.0)
Hemoglobin: 10.6 g/dL — ABNORMAL LOW (ref 11.7–15.5)
MCH: 30.8 pg (ref 27.0–33.0)
MCHC: 33 g/dL (ref 32.0–36.0)
MCV: 93.3 fL (ref 80.0–100.0)
MPV: 11.4 fL (ref 7.5–12.5)
Platelets: 219 10*3/uL (ref 140–400)
RBC: 3.44 10*6/uL — ABNORMAL LOW (ref 3.80–5.10)
RDW: 13.4 % (ref 11.0–15.0)
WBC: 7.6 10*3/uL (ref 3.8–10.8)

## 2020-04-12 LAB — 2HR GTT W 1 HR, CARPENTER, 75 G
Glucose, 1 Hr, Gest: 107 mg/dL (ref 65–179)
Glucose, 2 Hr, Gest: 99 mg/dL (ref 65–152)
Glucose, Fasting, Gest: 73 mg/dL (ref 65–91)

## 2020-04-12 LAB — RPR: RPR Ser Ql: NONREACTIVE

## 2020-04-12 LAB — HIV ANTIBODY (ROUTINE TESTING W REFLEX): HIV 1&2 Ab, 4th Generation: NONREACTIVE

## 2020-04-26 ENCOUNTER — Other Ambulatory Visit: Payer: Self-pay

## 2020-04-26 ENCOUNTER — Ambulatory Visit (INDEPENDENT_AMBULATORY_CARE_PROVIDER_SITE_OTHER): Payer: BC Managed Care – PPO | Admitting: Advanced Practice Midwife

## 2020-04-26 VITALS — BP 108/70 | HR 83 | Wt 197.0 lb

## 2020-04-26 DIAGNOSIS — Z3A30 30 weeks gestation of pregnancy: Secondary | ICD-10-CM

## 2020-04-26 DIAGNOSIS — Z3480 Encounter for supervision of other normal pregnancy, unspecified trimester: Secondary | ICD-10-CM
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
## 2020-04-26 NOTE — Patient Instructions (Addendum)
Considering Waterbirth? °Guide for patients at [facility] °Why consider waterbirth? °• Gentle birth for babies  °• Less pain medicine used in labor  °• May allow for passive descent/less pushing  °• May reduce perineal tears  °• More mobility and instinctive maternal position changes  °• Increased maternal relaxation  °• Reduced blood pressure in labor  ° °Is waterbirth safe? What are the risks of infection, drowning or other complications? °• Infection:  °• Very low risk (3.7 % for tub vs 4.8% for bed)  °• 7 in 8000 waterbirths with documented infection  °• Poorly cleaned equipment most common cause  °• Slightly lower group B strep transmission rate  °• Drowning  °• Maternal:  °• Very low risk  °• Related to seizures or fainting  °• Newborn:  °• Very low risk. No evidence of increased risk of respiratory problems in multiple large studies  °• Physiological protection from breathing under water  °• Avoid underwater birth if there are any fetal complications  °• Once baby's head is out of the water, keep it out.  °• Birth complication  °• Some reports of cord trauma, but risk decreased by bringing baby to surface gradually  °• No evidence of increased risk of shoulder dystocia. Mothers can usually change positions faster in water than in a bed, possibly aiding the maneuvers to free the shoulder.  °? °You must attend a Waterbirth class at [facility] [place] °• 3rd Wednesday of every month from 7-9pm  °• Free  °• Register by calling [phone] or online at [email or web address]  °• Bring us the certificate from the class to your prenatal appointment  °Meet with a midwife at 36 weeks to see if you can still plan a waterbirth and to sign the consent.  ° °If you plan a waterbirth at [facility] [place], you can opt to purchase the following: °• Fish Net °• Bathing suit top (optional)  °• Long-handled mirror (optional)  °•  °Things that would  prevent you from having a waterbirth: °• Unknown or Positive COVID-19 diagnosis upon admission to hospital  °• Premature, <37wks  °• Previous cesarean birth  °• Presence of thick meconium-stained fluid  °• Multiple gestation (Twins, triplets, etc.)  °• Uncontrolled diabetes or gestational diabetes requiring medication  °• Hypertension requiring medication or diagnosis of pre-eclampsia  °• Heavy vaginal bleeding  °• Non-reassuring fetal heart rate  °• Active infection (MRSA, etc.). Group B Strep is NOT a contraindication for waterbirth.  °• If your labor has to be induced and induction method requires continuous monitoring of the baby's heart rate  °• Other risks/issues identified by your obstetrical provider  °Please remember that birth is unpredictable. Under certain unforeseeable circumstances your provider may advise against giving birth in the tub. These decisions will be made on a case-by-case basis and with the safety of you and your baby as our highest priority. ° °**Please remember that in order to have a waterbirth, you must test Negative to COVID-19 upon admission to the hospital.** ° °

## 2020-04-27 DIAGNOSIS — F321 Major depressive disorder, single episode, moderate: Secondary | ICD-10-CM | POA: Diagnosis not present

## 2020-05-04 DIAGNOSIS — F331 Major depressive disorder, recurrent, moderate: Secondary | ICD-10-CM | POA: Diagnosis not present

## 2020-05-17 ENCOUNTER — Other Ambulatory Visit: Payer: Self-pay

## 2020-05-17 ENCOUNTER — Encounter: Payer: Self-pay | Admitting: Certified Nurse Midwife

## 2020-05-17 ENCOUNTER — Ambulatory Visit (INDEPENDENT_AMBULATORY_CARE_PROVIDER_SITE_OTHER): Payer: BC Managed Care – PPO | Admitting: Certified Nurse Midwife

## 2020-05-17 VITALS — BP 115/68 | HR 90 | Wt 201.0 lb

## 2020-05-17 DIAGNOSIS — O99891 Other specified diseases and conditions complicating pregnancy: Secondary | ICD-10-CM

## 2020-05-17 DIAGNOSIS — Z2839 Other underimmunization status: Secondary | ICD-10-CM

## 2020-05-17 DIAGNOSIS — Z3480 Encounter for supervision of other normal pregnancy, unspecified trimester: Secondary | ICD-10-CM

## 2020-05-17 DIAGNOSIS — Z23 Encounter for immunization: Secondary | ICD-10-CM

## 2020-05-17 DIAGNOSIS — Z3A33 33 weeks gestation of pregnancy: Secondary | ICD-10-CM

## 2020-05-17 DIAGNOSIS — Z283 Underimmunization status: Secondary | ICD-10-CM

## 2020-05-17 NOTE — Progress Notes (Signed)
   PRENATAL VISIT NOTE  Subjective:  Patricia Barber is a 29 y.o. G3P1011 at 45w5dbeing seen today for ongoing prenatal care.  She is currently monitored for the following issues for this low-risk pregnancy and has Supervision of other normal pregnancy, antepartum; Maternal obesity, antepartum; PCOS (polycystic ovarian syndrome); Headache in pregnancy, antepartum; and Rubella non-immune status, antepartum on their problem list.  Patient reports round ligament pain.  Contractions: Not present. Vag. Bleeding: None.  Movement: Present. Denies leaking of fluid.   The following portions of the patient's history were reviewed and updated as appropriate: allergies, current medications, past family history, past medical history, past social history, past surgical history and problem list.   Objective:   Vitals:   05/17/20 0828  BP: 115/68  Pulse: 90  Weight: 201 lb (91.2 kg)    Fetal Status: Fetal Heart Rate (bpm): 143 Fundal Height: 35 cm Movement: Present     General:  Alert, oriented and cooperative. Patient is in no acute distress.  Skin: Skin is warm and dry. No rash noted.   Cardiovascular: Normal heart rate noted  Respiratory: Normal respiratory effort, no problems with respiration noted  Abdomen: Soft, gravid, appropriate for gestational age.  Pain/Pressure: Present     Pelvic: Cervical exam deferred        Extremities: Normal range of motion.  Edema: None  Mental Status: Normal mood and affect. Normal behavior. Normal judgment and thought content.   Assessment and Plan:  Pregnancy: G3P1011 at 357w5d. [redacted] weeks gestation of pregnancy - preterm labor precautions discussed and reasons to present to MAU   2. Supervision of other normal pregnancy, antepartum - patient doing well, reports occasional round ligament pain  - educated and discussed with patient RLP and use of heating pads, tylenol, shower for relief  - TWG 4lb during pregnancy, continue to monitor closely  - Routine  prenatal care - Anticipatory guidance on upcoming appointments with next being in person for GBS culture. Discussed with patient results of GBS and recommendation of antibiotics if culture positive, patient verbalizes understanding.   3. Rubella non-immune status, antepartum - MMR PP   Preterm labor symptoms and general obstetric precautions including but not limited to vaginal bleeding, contractions, leaking of fluid and fetal movement were reviewed in detail with the patient. Please refer to After Visit Summary for other counseling recommendations.   Return in about 3 weeks (around 06/07/2020) for ROB/GBS- with midwife.  Future Appointments  Date Time Provider [place]10/07/2020  3:10 PM Leftwich-Kirby, LiKathie DikeCNM [facility]    VeLajean ManesCNM

## 2020-05-17 NOTE — Patient Instructions (Signed)

## 2020-06-02 DIAGNOSIS — F331 Major depressive disorder, recurrent, moderate: Secondary | ICD-10-CM | POA: Diagnosis not present

## 2020-06-02 DIAGNOSIS — F321 Major depressive disorder, single episode, moderate: Secondary | ICD-10-CM | POA: Diagnosis not present

## 2020-06-07 ENCOUNTER — Other Ambulatory Visit (HOSPITAL_COMMUNITY)
Admission: RE | Admit: 2020-06-07 | Discharge: 2020-06-07 | Disposition: A | Discharge status: Home / Self Care | Payer: BC Managed Care – PPO | Source: Ambulatory Visit | Attending: Advanced Practice Midwife | Admitting: Advanced Practice Midwife

## 2020-06-07 ENCOUNTER — Ambulatory Visit (INDEPENDENT_AMBULATORY_CARE_PROVIDER_SITE_OTHER): Payer: BC Managed Care – PPO | Admitting: Advanced Practice Midwife

## 2020-06-07 ENCOUNTER — Other Ambulatory Visit: Payer: Self-pay

## 2020-06-07 VITALS — BP 102/66 | HR 85 | Wt 201.0 lb

## 2020-06-07 DIAGNOSIS — Z3480 Encounter for supervision of other normal pregnancy, unspecified trimester: Secondary | ICD-10-CM

## 2020-06-07 DIAGNOSIS — Z3A36 36 weeks gestation of pregnancy: Secondary | ICD-10-CM | POA: Diagnosis not present

## 2020-06-07 NOTE — Patient Instructions (Signed)
Things to Try After 37 weeks to Encourage Labor/Get Ready for Labor:   1.  Try the Miles Circuit at www.milescircuit.com daily to improve baby's position and encourage the onset of labor.  2. Walk a little and rest a little every day.  Change positions often.  3. Cervical Ripening: May try one or both a. Red Raspberry Leaf capsules or tea:  two 300mg or 400mg tablets with each meal, 2-3 times a day, or 1-3 cups of tea daily  Potential Side Effects Of Raspberry Leaf:  Most women do not experience any side effects from drinking raspberry leaf tea. However, nausea and loose stools are possible   b. Evening Primrose Oil capsules: may take 1 to 3 capsules daily. Take 1-2 capsules by mouth each day and place one capsule vaginally at night.  You may also prick the vaginal capsule to release the oil prior to inserting in the vagina. Some of the potential side effects:  Upset stomach  Loose stools or diarrhea  Headaches  Nausea  4. Sex (and especially sex with orgasm) can also help the cervix ripen and encourage labor onset.  Labor Precautions Reasons to come to MAU at [facility]:  1.  Contractions are  5 minutes apart or less, each last 1 minute, these have been going on for 1-2 hours, and you cannot walk or talk during them 2.  You have a large gush of fluid, or a trickle of fluid that will not stop and you have to wear a pad 3.  You have bleeding that is bright red, heavier than spotting--like menstrual bleeding (spotting can be normal in early labor or after a check of your cervix) 4.  You do not feel the baby moving like he/she normally does 

## 2020-06-07 NOTE — Progress Notes (Signed)
   PRENATAL VISIT NOTE  Subjective:  Patricia Barber is a 29 y.o. G3P1011 at [redacted]w[redacted]d being seen today for ongoing prenatal care.  She is currently monitored for the following issues for this low-risk pregnancy and has Supervision of other normal pregnancy, antepartum; Maternal obesity, antepartum; PCOS (polycystic ovarian syndrome); Headache in pregnancy, antepartum; and Rubella non-immune status, antepartum on their problem list.  Patient reports no complaints.  Contractions: Not present. Vag. Bleeding: None.  Movement: Present. Denies leaking of fluid.   The following portions of the patient's history were reviewed and updated as appropriate: allergies, current medications, past family history, past medical history, past social history, past surgical history and problem list.   Objective:   Vitals:   06/07/20 1514  BP: 102/66  Pulse: 85  Weight: 201 lb (91.2 kg)    Fetal Status:   Fundal Height: 36 cm Movement: Present  Presentation: Vertex  General:  Alert, oriented and cooperative. Patient is in no acute distress.  Skin: Skin is warm and dry. No rash noted.   Cardiovascular: Normal heart rate noted  Respiratory: Normal respiratory effort, no problems with respiration noted  Abdomen: Soft, gravid, appropriate for gestational age.  Pain/Pressure: Present     Pelvic: Cervical exam performed in the presence of a chaperone Dilation: 1 Effacement (%): 30 Station: -3  Extremities: Normal range of motion.  Edema: None  Mental Status: Normal mood and affect. Normal behavior. Normal judgment and thought content.   Assessment and Plan:  Pregnancy: G3P1011 at [redacted]w[redacted]d 1. [redacted] weeks gestation of pregnancy - Culture, beta strep (group b only) - Cervicovaginal ancillary only( [place])  2. Supervision of other normal pregnancy, antepartum --Anticipatory guidance about next visits/weeks of pregnancy given. --Next visit in 2 weeks in office --Reviewed labor readiness with patient including the  [facility] Circuit, evening primrose oil, and raspberry leaf tea.  --Labor precautions reviewed --contraindications to waterbirth reviewed, reasons plans may need to change including HTN, FHR decelerations, or thick meconium.  Pt states understanding. --Pt took class, will upload into MyChart.  Consent signed and in chart.  Term labor symptoms and general obstetric precautions including but not limited to vaginal bleeding, contractions, leaking of fluid and fetal movement were reviewed in detail with the patient. Please refer to After Visit Summary for other counseling recommendations.   Return in about 2 weeks (around 06/21/2020).  No future appointments.  Sharen Counter, CNM

## 2020-06-08 LAB — CERVICOVAGINAL ANCILLARY ONLY
Chlamydia: NEGATIVE
Comment: NEGATIVE
Comment: NORMAL
Neisseria Gonorrhea: NEGATIVE

## 2020-06-09 ENCOUNTER — Encounter: Payer: Self-pay | Admitting: *Deleted

## 2020-06-09 LAB — CULTURE, BETA STREP (GROUP B ONLY)

## 2020-06-12 LAB — CULTURE, BETA STREP (GROUP B ONLY)
MICRO NUMBER:: 11064290
SPECIMEN QUALITY:: ADEQUATE

## 2020-06-13 ENCOUNTER — Encounter: Payer: Self-pay | Admitting: Advanced Practice Midwife

## 2020-06-13 DIAGNOSIS — O99820 Streptococcus B carrier state complicating pregnancy: Secondary | ICD-10-CM | POA: Insufficient documentation

## 2020-06-20 ENCOUNTER — Ambulatory Visit (INDEPENDENT_AMBULATORY_CARE_PROVIDER_SITE_OTHER): Payer: BC Managed Care – PPO | Admitting: Obstetrics & Gynecology

## 2020-06-20 ENCOUNTER — Other Ambulatory Visit: Payer: Self-pay

## 2020-06-20 VITALS — BP 113/79 | HR 99 | Wt 199.0 lb

## 2020-06-20 DIAGNOSIS — Z3480 Encounter for supervision of other normal pregnancy, unspecified trimester: Secondary | ICD-10-CM

## 2020-06-20 DIAGNOSIS — B951 Streptococcus, group B, as the cause of diseases classified elsewhere: Secondary | ICD-10-CM

## 2020-06-20 DIAGNOSIS — O2343 Unspecified infection of urinary tract in pregnancy, third trimester: Secondary | ICD-10-CM

## 2020-06-20 NOTE — Progress Notes (Signed)
   PRENATAL VISIT NOTE  Subjective:  Patricia Barber is a 29 y.o. G3P1011 at [redacted]w[redacted]d being seen today for ongoing prenatal care.  She is currently monitored for the following issues for this low-risk pregnancy and has Supervision of other normal pregnancy, antepartum; Maternal obesity, antepartum; PCOS (polycystic ovarian syndrome); Headache in pregnancy, antepartum; Rubella non-immune status, antepartum; and GBS (group B Streptococcus carrier), +RV culture, currently pregnant on their problem list.  Patient reports backache.  Contractions: Not present.  .  Movement: Present. Denies leaking of fluid.   The following portions of the patient's history were reviewed and updated as appropriate: allergies, current medications, past family history, past medical history, past social history, past surgical history and problem list.   Objective:   Vitals:   06/20/20 1120  BP: 113/79  Pulse: 99  Weight: 199 lb (90.3 kg)    Fetal Status: Fetal Heart Rate (bpm): 155   Movement: Present     General:  Alert, oriented and cooperative. Patient is in no acute distress.  Skin: Skin is warm and dry. No rash noted.   Cardiovascular: Normal heart rate noted  Respiratory: Normal respiratory effort, no problems with respiration noted  Abdomen: Soft, gravid, appropriate for gestational age.  Pain/Pressure: Present     Pelvic: Cervical exam performed in the presence of a chaperone      2/50/-2/mid position vertex  Extremities: Normal range of motion.  Edema: None  Mental Status: Normal mood and affect. Normal behavior. Normal judgment and thought content.   Assessment and Plan:  Pregnancy: G3P1011 at [redacted]w[redacted]d 1.  GBS +-->Rx in labor 2.  Considering post placental IUD 3.   Water Birth  Term labor symptoms and general obstetric precautions including but not limited to vaginal bleeding, contractions, leaking of fluid and fetal movement were reviewed in detail with the patient. Please refer to After Visit Summary  for other counseling recommendations.   Return in about 1 week (around 06/27/2020).   Elsie Lincoln, MD

## 2020-06-21 ENCOUNTER — Encounter: Payer: BC Managed Care – PPO | Admitting: Advanced Practice Midwife

## 2020-06-28 ENCOUNTER — Encounter (INDEPENDENT_AMBULATORY_CARE_PROVIDER_SITE_OTHER): Payer: Self-pay

## 2020-06-28 ENCOUNTER — Other Ambulatory Visit: Payer: Self-pay

## 2020-06-28 ENCOUNTER — Encounter: Payer: Self-pay | Admitting: Certified Nurse Midwife

## 2020-06-28 ENCOUNTER — Ambulatory Visit (INDEPENDENT_AMBULATORY_CARE_PROVIDER_SITE_OTHER): Payer: BC Managed Care – PPO | Admitting: Certified Nurse Midwife

## 2020-06-28 VITALS — BP 110/74 | HR 84 | Wt 203.0 lb

## 2020-06-28 DIAGNOSIS — O99891 Other specified diseases and conditions complicating pregnancy: Secondary | ICD-10-CM

## 2020-06-28 DIAGNOSIS — Z3480 Encounter for supervision of other normal pregnancy, unspecified trimester: Secondary | ICD-10-CM

## 2020-06-28 DIAGNOSIS — O09899 Supervision of other high risk pregnancies, unspecified trimester: Secondary | ICD-10-CM

## 2020-06-28 DIAGNOSIS — O99820 Streptococcus B carrier state complicating pregnancy: Secondary | ICD-10-CM

## 2020-06-28 DIAGNOSIS — Z3A39 39 weeks gestation of pregnancy: Secondary | ICD-10-CM

## 2020-06-28 DIAGNOSIS — Z283 Underimmunization status: Secondary | ICD-10-CM

## 2020-06-28 NOTE — Progress Notes (Signed)
   PRENATAL VISIT NOTE  Subjective:  Patricia Barber is a 29 y.o. G3P1011 at [redacted]w[redacted]d being seen today for ongoing prenatal care.  She is currently monitored for the following issues for this low-risk pregnancy and has Supervision of other normal pregnancy, antepartum; Maternal obesity, antepartum; PCOS (polycystic ovarian syndrome); Headache in pregnancy, antepartum; Rubella non-immune status, antepartum; and GBS (group B Streptococcus carrier), +RV culture, currently pregnant on their problem list.  Patient reports occasional contractions.  Contractions: Irritability. Vag. Bleeding: None.  Movement: Present. Denies leaking of fluid.   The following portions of the patient's history were reviewed and updated as appropriate: allergies, current medications, past family history, past medical history, past social history, past surgical history and problem list.   Objective:   Vitals:   06/28/20 1108  BP: 110/74  Pulse: 84  Weight: 203 lb (92.1 kg)    Fetal Status: Fetal Heart Rate (bpm): 137   Movement: Present  Presentation: Vertex  General:  Alert, oriented and cooperative. Patient is in no acute distress.  Skin: Skin is warm and dry. No rash noted.   Cardiovascular: Normal heart rate noted  Respiratory: Normal respiratory effort, no problems with respiration noted  Abdomen: Soft, gravid, appropriate for gestational age.  Pain/Pressure: Present     Pelvic: Cervical exam performed in the presence of a chaperone Dilation: 1.5 Effacement (%): 50 Station: -3  Extremities: Normal range of motion.  Edema: None  Mental Status: Normal mood and affect. Normal behavior. Normal judgment and thought content.   Assessment and Plan:  Pregnancy: G3P1011 at [redacted]w[redacted]d 1. [redacted] weeks gestation of pregnancy - educated and discussed with patient antenatal screening post dates  - discussed with patient IOL, patient does not want to be induced or set up induction at this time  - Patient wants to wait until after 41  weeks to be induced (closer to 41.4). Discussed with patient induction options of cytotec and AROM in order for patient to have water birth   2. Supervision of other normal pregnancy, antepartum - Routine prenatal care - Anticipatory guidance on  Upcoming appointments with next week NST 2x weekly for postdates antenatal screening  - membrane sweep performed today per patient request, patient reports that she was 2cm at appointment last week   3. Rubella non-immune status, antepartum - MMR PP   4. GBS (group B Streptococcus carrier), +RV culture, currently pregnant - treat during labor   Term labor symptoms and general obstetric precautions including but not limited to vaginal bleeding, contractions, leaking of fluid and fetal movement were reviewed in detail with the patient. Please refer to After Visit Summary for other counseling recommendations.   Return in about 3 days (around 07/01/2020) for ROB, NST.  Future Appointments  Date Time Provider [place]  07/01/2020 10:50 AM Elvera Maria CNM [facility]  07/05/2020 10:00 AM [facility] NURSE [facility]  07/08/2020 10:30 AM Julianne Handler, [place], CNM

## 2020-06-28 NOTE — Patient Instructions (Signed)
Reasons to go to MAU:  1.  Contractions are  5 minutes apart or less, each last 1 minute, these have been going on for 1-2 hours, and you cannot walk or talk during them 2.  You have a large gush of fluid, or a trickle of fluid that will not stop and you have to wear a pad 3.  You have bleeding that is bright red, heavier than spotting--like menstrual bleeding (spotting can be normal in early labor or after a check of your cervix) 4.  You do not feel the baby moving like he/she normally does  

## 2020-07-01 ENCOUNTER — Other Ambulatory Visit: Payer: Self-pay

## 2020-07-01 ENCOUNTER — Ambulatory Visit (INDEPENDENT_AMBULATORY_CARE_PROVIDER_SITE_OTHER): Payer: BC Managed Care – PPO | Admitting: Advanced Practice Midwife

## 2020-07-01 VITALS — BP 110/77 | HR 88 | Wt 204.0 lb

## 2020-07-01 DIAGNOSIS — Z3A40 40 weeks gestation of pregnancy: Secondary | ICD-10-CM | POA: Diagnosis not present

## 2020-07-01 DIAGNOSIS — Z3480 Encounter for supervision of other normal pregnancy, unspecified trimester: Secondary | ICD-10-CM

## 2020-07-01 NOTE — Patient Instructions (Signed)
Things to Try After 37 weeks to Encourage Labor/Get Ready for Labor:   1.  Try the Miles Circuit at www.milescircuit.com daily to improve baby's position and encourage the onset of labor.  2. Walk a little and rest a little every day.  Change positions often.  3. Cervical Ripening: May try one or both a. Red Raspberry Leaf capsules or tea:  two 300mg or 400mg tablets with each meal, 2-3 times a day, or 1-3 cups of tea daily  Potential Side Effects Of Raspberry Leaf:  Most women do not experience any side effects from drinking raspberry leaf tea. However, nausea and loose stools are possible   b. Evening Primrose Oil capsules: may take 1 to 3 capsules daily. Take 1-2 capsules by mouth each day and place one capsule vaginally at night.  You may also prick the vaginal capsule to release the oil prior to inserting in the vagina. Some of the potential side effects:  Upset stomach  Loose stools or diarrhea  Headaches  Nausea  4. Sex (and especially sex with orgasm) can also help the cervix ripen and encourage labor onset.  Labor Precautions Reasons to come to MAU at [facility]:  1.  Contractions are  5 minutes apart or less, each last 1 minute, these have been going on for 1-2 hours, and you cannot walk or talk during them 2.  You have a large gush of fluid, or a trickle of fluid that will not stop and you have to wear a pad 3.  You have bleeding that is bright red, heavier than spotting--like menstrual bleeding (spotting can be normal in early labor or after a check of your cervix) 4.  You do not feel the baby moving like he/she normally does 

## 2020-07-01 NOTE — Progress Notes (Signed)
   PRENATAL VISIT NOTE  Subjective:  Patricia Barber is a 29 y.o. G3P1011 at [redacted]w[redacted]d being seen today for ongoing prenatal care.  She is currently monitored for the following issues for this low-risk pregnancy and has Supervision of other normal pregnancy, antepartum; Maternal obesity, antepartum; PCOS (polycystic ovarian syndrome); Headache in pregnancy, antepartum; Rubella non-immune status, antepartum; and GBS (group B Streptococcus carrier), +RV culture, currently pregnant on their problem list.  Patient reports occasional contractions.  Contractions: Irritability. Vag. Bleeding: None.  Movement: Present. Denies leaking of fluid.   The following portions of the patient's history were reviewed and updated as appropriate: allergies, current medications, past family history, past medical history, past social history, past surgical history and problem list.   Objective:   Vitals:   07/01/20 1125  BP: 110/77  Pulse: 88  Weight: 204 lb (92.5 kg)    Fetal Status: Fetal Heart Rate (bpm): NST-R Fundal Height: 40 cm Movement: Present  Presentation: Vertex  General:  Alert, oriented and cooperative. Patient is in no acute distress.  Skin: Skin is warm and dry. No rash noted.   Cardiovascular: Normal heart rate noted  Respiratory: Normal respiratory effort, no problems with respiration noted  Abdomen: Soft, gravid, appropriate for gestational age.  Pain/Pressure: Present     Pelvic: Cervical exam performed in the presence of a chaperone Dilation: 4 Effacement (%): 70 Station: -2  Extremities: Normal range of motion.  Edema: None  Mental Status: Normal mood and affect. Normal behavior. Normal judgment and thought content.   Assessment and Plan:  Pregnancy: G3P1011 at [redacted]w[redacted]d 1. [redacted] weeks gestation of pregnancy --NST-R today  2. Supervision of other normal pregnancy, antepartum --Anticipatory guidance about next visits/weeks of pregnancy given. --Membranes swept at pt request today, pt to do [employer] positions today, encourage onset of labor --Questions answered about induction. Pt desires waterbirth and would like to try options that do not require continuous EFM before Pitocin first.  Discussed nipple stimulation, Cytotec, AROM with pt and husband and questions answered. --She does not want to schedule IOL at 41 weeks at this time, prefers to wait until 41.3 or 41.4  --NST-R today.  Fetal kick counts reviewed. --Next appt in 4 days for NST, added provider visit so membranes can be swept again if needed  Term labor symptoms and general obstetric precautions including but not limited to vaginal bleeding, contractions, leaking of fluid and fetal movement were reviewed in detail with the patient. Please refer to After Visit Summary for other counseling recommendations.   No follow-ups on file.  Future Appointments  Date Time Provider Department Center  07/05/2020 10:00 AM [facility] NURSE [facility]  07/08/2020 10:30 AM Donette Larry, CNM [facility]    Sharen Counter, CNM

## 2020-07-02 ENCOUNTER — Inpatient Hospital Stay (HOSPITAL_COMMUNITY)
Admission: AD | Admit: 2020-07-02 | Discharge: 2020-07-04 | DRG: 807 | Disposition: A | Discharge status: Home / Self Care | Payer: BC Managed Care – PPO | Attending: Obstetrics & Gynecology | Admitting: Obstetrics & Gynecology

## 2020-07-02 ENCOUNTER — Other Ambulatory Visit: Payer: Self-pay

## 2020-07-02 ENCOUNTER — Encounter (HOSPITAL_COMMUNITY): Payer: Self-pay | Admitting: Obstetrics and Gynecology

## 2020-07-02 DIAGNOSIS — O26893 Other specified pregnancy related conditions, third trimester: Secondary | ICD-10-CM | POA: Diagnosis not present

## 2020-07-02 DIAGNOSIS — O99824 Streptococcus B carrier state complicating childbirth: Secondary | ICD-10-CM | POA: Diagnosis not present

## 2020-07-02 DIAGNOSIS — Z3A40 40 weeks gestation of pregnancy: Secondary | ICD-10-CM

## 2020-07-02 DIAGNOSIS — B951 Streptococcus, group B, as the cause of diseases classified elsewhere: Secondary | ICD-10-CM | POA: Diagnosis not present

## 2020-07-02 DIAGNOSIS — O99214 Obesity complicating childbirth: Secondary | ICD-10-CM | POA: Diagnosis not present

## 2020-07-02 DIAGNOSIS — Z20822 Contact with and (suspected) exposure to covid-19: Secondary | ICD-10-CM | POA: Diagnosis present

## 2020-07-02 DIAGNOSIS — O99344 Other mental disorders complicating childbirth: Secondary | ICD-10-CM | POA: Diagnosis present

## 2020-07-02 DIAGNOSIS — Z9889 Other specified postprocedural states: Secondary | ICD-10-CM | POA: Diagnosis not present

## 2020-07-02 DIAGNOSIS — Z23 Encounter for immunization: Secondary | ICD-10-CM | POA: Diagnosis not present

## 2020-07-02 DIAGNOSIS — E669 Obesity, unspecified: Secondary | ICD-10-CM | POA: Diagnosis present

## 2020-07-02 DIAGNOSIS — F419 Anxiety disorder, unspecified: Secondary | ICD-10-CM | POA: Diagnosis present

## 2020-07-02 DIAGNOSIS — Z3493 Encounter for supervision of normal pregnancy, unspecified, third trimester: Secondary | ICD-10-CM

## 2020-07-02 LAB — CBC
HCT: 33.4 % — ABNORMAL LOW (ref 36.0–46.0)
Hemoglobin: 11 g/dL — ABNORMAL LOW (ref 12.0–15.0)
MCH: 30.6 pg (ref 26.0–34.0)
MCHC: 32.9 g/dL (ref 30.0–36.0)
MCV: 93 fL (ref 80.0–100.0)
Platelets: 196 10*3/uL (ref 150–400)
RBC: 3.59 MIL/uL — ABNORMAL LOW (ref 3.87–5.11)
RDW: 14.3 % (ref 11.5–15.5)
WBC: 8.5 10*3/uL (ref 4.0–10.5)
nRBC: 0 % (ref 0.0–0.2)

## 2020-07-02 LAB — RPR: RPR Ser Ql: NONREACTIVE

## 2020-07-02 LAB — TYPE AND SCREEN
ABO/RH(D): A POS
Antibody Screen: NEGATIVE

## 2020-07-02 LAB — RESPIRATORY PANEL BY RT PCR (FLU A&B, COVID)
Influenza A by PCR: NEGATIVE
Influenza B by PCR: NEGATIVE
SARS Coronavirus 2 by RT PCR: NEGATIVE

## 2020-07-02 MED ORDER — ONDANSETRON HCL 4 MG PO TABS: 4.0000 mg | ORAL_TABLET | ORAL | Status: DC | PRN

## 2020-07-02 MED ORDER — ACETAMINOPHEN 325 MG PO TABS: 650.0000 mg | ORAL_TABLET | ORAL | Status: DC | PRN

## 2020-07-02 MED ORDER — ONDANSETRON HCL 4 MG/2ML IJ SOLN: 4.0000 mg | INTRAMUSCULAR | Status: DC | PRN

## 2020-07-02 MED ORDER — ONDANSETRON HCL 4 MG/2ML IJ SOLN: 4.0000 mg | Freq: Four times a day (QID) | INTRAMUSCULAR | Status: DC | PRN

## 2020-07-02 MED ORDER — PRENATAL MULTIVITAMIN CH
1.0000 | ORAL_TABLET | Freq: Every day | ORAL | Status: DC
  Administered 2020-07-02 – 2020-07-04 (×3): 1 via ORAL
  Filled 2020-07-02 (×3): qty 1

## 2020-07-02 MED ORDER — ACETAMINOPHEN 325 MG PO TABS
650.0000 mg | ORAL_TABLET | ORAL | Status: DC | PRN
  Filled 2020-07-02: qty 2

## 2020-07-02 MED ORDER — OXYCODONE HCL 5 MG PO TABS: 10.0000 mg | ORAL_TABLET | ORAL | Status: DC | PRN

## 2020-07-02 MED ORDER — OXYTOCIN BOLUS FROM INFUSION
333.0000 mL | Freq: Once | INTRAVENOUS | Status: AC
  Administered 2020-07-02: 333 mL via INTRAVENOUS

## 2020-07-02 MED ORDER — WITCH HAZEL-GLYCERIN EX PADS: 1.0000 "application " | MEDICATED_PAD | CUTANEOUS | Status: DC | PRN

## 2020-07-02 MED ORDER — LIDOCAINE HCL (PF) 1 % IJ SOLN
30.0000 mL | INTRAMUSCULAR | Status: AC | PRN
  Administered 2020-07-02: 30 mL via SUBCUTANEOUS
  Filled 2020-07-02: qty 30

## 2020-07-02 MED ORDER — SODIUM CHLORIDE 0.9 % IV SOLN
2.0000 g | Freq: Four times a day (QID) | INTRAVENOUS | Status: DC
  Administered 2020-07-02: 2 g via INTRAVENOUS
  Filled 2020-07-02: qty 2000

## 2020-07-02 MED ORDER — COCONUT OIL OIL
1.0000 "application " | TOPICAL_OIL | Status: DC | PRN
  Administered 2020-07-02: 1 via TOPICAL

## 2020-07-02 MED ORDER — IBUPROFEN 600 MG PO TABS
600.0000 mg | ORAL_TABLET | Freq: Four times a day (QID) | ORAL | Status: DC
  Administered 2020-07-02 – 2020-07-04 (×9): 600 mg via ORAL
  Filled 2020-07-02 (×9): qty 1

## 2020-07-02 MED ORDER — DIPHENHYDRAMINE HCL 25 MG PO CAPS: 25.0000 mg | ORAL_CAPSULE | Freq: Four times a day (QID) | ORAL | Status: DC | PRN

## 2020-07-02 MED ORDER — BENZOCAINE-MENTHOL 20-0.5 % EX AERO
1.0000 "application " | INHALATION_SPRAY | CUTANEOUS | Status: DC | PRN
  Administered 2020-07-02: 1 via TOPICAL
  Filled 2020-07-02: qty 56

## 2020-07-02 MED ORDER — OXYCODONE-ACETAMINOPHEN 5-325 MG PO TABS: 2.0000 | ORAL_TABLET | ORAL | Status: DC | PRN

## 2020-07-02 MED ORDER — DIBUCAINE (PERIANAL) 1 % EX OINT: 1.0000 "application " | TOPICAL_OINTMENT | CUTANEOUS | Status: DC | PRN

## 2020-07-02 MED ORDER — OXYCODONE-ACETAMINOPHEN 5-325 MG PO TABS: 1.0000 | ORAL_TABLET | ORAL | Status: DC | PRN

## 2020-07-02 MED ORDER — LACTATED RINGERS IV SOLN: 500.0000 mL | INTRAVENOUS | Status: DC | PRN

## 2020-07-02 MED ORDER — SIMETHICONE 80 MG PO CHEW: 80.0000 mg | CHEWABLE_TABLET | ORAL | Status: DC | PRN

## 2020-07-02 MED ORDER — ZOLPIDEM TARTRATE 5 MG PO TABS: 5.0000 mg | ORAL_TABLET | Freq: Every evening | ORAL | Status: DC | PRN

## 2020-07-02 MED ORDER — SOD CITRATE-CITRIC ACID 500-334 MG/5ML PO SOLN: 30.0000 mL | ORAL | Status: DC | PRN

## 2020-07-02 MED ORDER — TETANUS-DIPHTH-ACELL PERTUSSIS 5-2.5-18.5 LF-MCG/0.5 IM SUSY: 0.5000 mL | PREFILLED_SYRINGE | Freq: Once | INTRAMUSCULAR | Status: DC

## 2020-07-02 MED ORDER — OXYCODONE HCL 5 MG PO TABS: 5.0000 mg | ORAL_TABLET | ORAL | Status: DC | PRN

## 2020-07-02 MED ORDER — OXYTOCIN-SODIUM CHLORIDE 30-0.9 UT/500ML-% IV SOLN
2.5000 [IU]/h | INTRAVENOUS | Status: DC
  Administered 2020-07-02: 2.5 [IU]/h via INTRAVENOUS
  Filled 2020-07-02: qty 500

## 2020-07-02 MED ORDER — LACTATED RINGERS IV SOLN: INTRAVENOUS | Status: DC

## 2020-07-02 MED ORDER — SENNOSIDES-DOCUSATE SODIUM 8.6-50 MG PO TABS
2.0000 | ORAL_TABLET | ORAL | Status: DC
  Administered 2020-07-02 – 2020-07-03 (×2): 2 via ORAL
  Filled 2020-07-02 (×2): qty 2

## 2020-07-02 NOTE — Progress Notes (Signed)
CSW acknowledges consult for anxiety. CSW went to speak with MOB at bedside to address further needs, however CSW noted that MOB was upright trying to nurse infant. CSW advised MOB that CSW would return around 3 pm to address further needs, MOB and FOB agreeable.   Claude Manges Melitza Metheny, LCSW [facility] Cell: [phone]

## 2020-07-02 NOTE — H&P (Signed)
LABOR AND DELIVERY ADMISSION HISTORY AND PHYSICAL NOTE  Patricia Barber is a 29 y.o. female G3P1011 with IUP at 24w2dby UKoreapresenting for spontaneous onset of labor. She reports positive fetal movement. She denies leakage of fluid or vaginal bleeding. Pt desires waterbirth and has completed the required class and and consent form signed.  Prenatal History/Complications: [facility]:  - Anxiety -Rubella non-immune  Past Medical History: Past Medical History:  Diagnosis Date  . Anxiety   . Headache     Past Surgical History: Past Surgical History:  Procedure Laterality Date  . WISDOM TOOTH EXTRACTION     Obstetrical History: OB History    Gravida  3   Para  1   Term  1   Preterm  0   AB  1   Living  1     SAB  1   TAB  0   Ectopic  0   Multiple  0   Live Births  1           Social History: Social History   Socioeconomic History  . Marital status: Married    Spouse name: Not on file  . Number of children: Not on file  . Years of education: Not on file  . Highest education level: Not on file  Occupational History  . Not on file  Tobacco Use  . Smoking status: Never Smoker  . Smokeless tobacco: Never Used  Vaping Use  . Vaping Use: Never used  Substance and Sexual Activity  . Alcohol use: No  . Drug use: No  . Sexual activity: Yes    Birth control/protection: None  Other Topics Concern  . Not on file  Social History Narrative  . Not on file   Social Determinants of Health   Financial Resource Strain:   . Difficulty of Paying Living Expenses: Not on file  Food Insecurity:   . Worried About RCharity fundraiserin the Last Year: Not on file  . Ran Out of Food in the Last Year: Not on file  Transportation Needs:   . Lack of Transportation (Medical): Not on file  . Lack of Transportation (Non-Medical): Not on file  Physical Activity:   . Days of Exercise per Week: Not on file  . Minutes of Exercise per Session:  Not on file  Stress:   . Feeling of Stress : Not on file  Social Connections:   . Frequency of Communication with Friends and Family: Not on file  . Frequency of Social Gatherings with Friends and Family: Not on file  . Attends Religious Services: Not on file  . Active Member of Clubs or Organizations: Not on file  . Attends CArchivistMeetings: Not on file  . Marital Status: Not on file    Family History: Family History  Problem Relation Age of Onset  . Asthma Mother   . Hyperlipidemia Father   . Hypertension Father   . Migraines Brother   . Stroke Paternal Grandmother   . Heart disease Paternal Grandfather   . Hyperlipidemia Paternal Grandfather   . Thyroid disease Neg Hx   . Alzheimer's disease Neg Hx     Allergies: Allergies  Allergen Reactions  . Lactose Diarrhea and Nausea And Vomiting    Medications Prior to Admission  Medication Sig Dispense Refill Last Dose  . cholecalciferol (VITAMIN D3) 25 MCG (1000 UNIT) tablet Take 1,000 Units by mouth daily. (Patient not taking: Reported on 06/28/2020)     .  famotidine (PEPCID) 20 MG tablet Take 1 tablet (20 mg total) by mouth 2 (two) times daily as needed for heartburn or indigestion. (Patient not taking: Reported on 06/20/2020) 60 tablet 2   . ketoconazole (NIZORAL) 2 % shampoo Apply topically. (Patient not taking: Reported on 06/20/2020)     . Prenatal Vit-Fe Fumarate-FA (MULTIVITAMIN-PRENATAL) 27-0.8 MG TABS tablet Take 1 tablet by mouth daily at 12 noon.      . sertraline (ZOLOFT) 50 MG tablet Take 50 mg by mouth daily.     . Vitamin D, Ergocalciferol, (DRISDOL) 1.25 MG (50000 UNIT) CAPS capsule Take 50,000 Units by mouth once a week.        Review of Systems  All systems reviewed and negative except as stated in HPI  Physical Exam Blood pressure 123/78, pulse (!) 103, temperature 98.8 F (37.1 C), resp. rate 16, last menstrual period 09/16/2019, SpO2 99 %, unknown if currently breastfeeding. General  appearance: alert, oriented, NAD Lungs: normal respiratory effort Heart: regular rate Abdomen: soft, non-tender; gravid, FH appropriate for GA Extremities: No calf swelling or tenderness Presentation: cephalic Fetal monitoring: BL 150/moderate variability/+accels/no decels Uterine activity: ctx every 7-9 min Dilation: 5.5 Effacement (%): 60 Station: -3 Exam by:: ginger morris rn  Prenatal labs: ABO, Rh: A/RH(D) POSITIVE/-- (04/01 1525) Antibody: NO ANTIBODIES DETECTED (04/01 1525) Rubella: <0.90 (04/01 1525) RPR: NON-REACTIVE (08/16 0827)  HBsAg: NON-REACTIVE (04/01 1525)  HIV: NON-REACTIVE (08/16 0827)  GC/Chlamydia: Negative GBS:  Positive 2-hr GTT: 99 Genetic screening: Low risk, negative Anatomy US: Normal anatomy  Prenatal Transfer Tool  Maternal Diabetes: No Genetic Screening: Normal Maternal Ultrasounds/Referrals: Normal Fetal Ultrasounds or other Referrals:  None Maternal Substance Abuse:  No Significant Maternal Medications:  Meds include: Other:  Significant Maternal Lab Results: Group B Strep positive  No results found for this or any previous visit (from the past 24 hour(s)).  Patient Active Problem List   Diagnosis Date Noted  . GBS (group B Streptococcus carrier), +RV culture, currently pregnant 06/13/2020  . Rubella non-immune status, antepartum 12/03/2019  . Headache in pregnancy, antepartum 11/26/2019  . Maternal obesity, antepartum 04/20/2019  . PCOS (polycystic ovarian syndrome) 04/20/2019  . Supervision of other normal pregnancy, antepartum 06/17/2016    Assessment: Patricia Barber is a 29 y.o. G3P1011 at 65w2dhere for SOL.   #Labor: Plan for manage expectantly. Pt desires water birth and has completed required class and consent signed. #Pain: TBD per patient #FWB: Category 1 strip #ID: GBS+ >ampicillin ordered on admission #MOF: breast #MOC: unsure #Circ: unsure (parents unaware of gender at this time) #Anxiety: plan for 1 week mood  check. #Rubella non-immune: plan for PP MMR

## 2020-07-02 NOTE — Lactation Note (Signed)
This note was copied from a baby's chart. Lactation Consultation Note  Patient Name: Patricia Barber [id number] Date: 07/02/2020 Reason for consult: Initial assessment;Maternal endocrine disorder;Term Type of Endocrine Disorder?: PCOS  Visited with mom of a 8 hours old FT female, she's a P2 and reported that she BF her first child only for the first month but provided breastmilk through pumping and bottle feeding until baby was 74 months old. She has a hx of PCOS and said that she'd like to BF this baby for longer. She has a Regulatory affairs officer and a [employer] DEBP at home.  Offered assistance with latch but mom politely declined, baby just fed. Asked mom to call for assistance when needed. She told LC she already feels comfortable with hand expression and declined assistance, mom voiced she's been able to get some colostrum. Reviewed normal newborn behavior, feeding cues, cluster feeding, size of baby's stomach and lactogenesis II.  Feeding plan:  1. Encouraged mom to feed baby STS 8-12 times/24 hours or sooner if feeding cues are present 2. Hand expression and spoon feeding were also encouraged 3. She requested to be seen tomorrow by lactation after the 24 hours mark when baby is having a more consistent feeding pattern.  BF brochure, BF resources and feeding diary were reviewed. Dad present and supportive. Parents reported all questions and concerns were answered, they're both aware of LC OP services and will call PRN.   Maternal Data Formula Feeding for Exclusion: No Has patient been taught Hand Expression?: Yes Does the patient have breastfeeding experience prior to this delivery?: Yes  Feeding Feeding Type: Breast Fed  LATCH Score                   Interventions Interventions: Breast feeding basics reviewed  Lactation Tools Discussed/Used WIC Program: No   Consult Status Consult Status: Follow-up Date: 07/02/20 Follow-up type: In-patient    Zaccheus Edmister Venetia Constable 07/02/2020,  4:07 PM

## 2020-07-02 NOTE — Progress Notes (Signed)
Phoned Thressa Sheller, CNM and requested she come immediately to room 117 to attend to patient who was in water birth tub actively laboring.  Neysa Bonito [facility]

## 2020-07-02 NOTE — Progress Notes (Signed)
CSW received consult for hx of Anxiety. CSW met with MOB to offer support and complete assessment.    CSW congratulated MOB and FOB on the birth of infant. CSW advised MOB of CSW's role and the reason for CSW coming to speak with her. MOB reported that she was diagnosed with anxiety a little while ago but does report a most recent diagnosis being "January 20218". MOB expressed that she was started on medications in September 2021 (Zoloft) in which MOB reports is very helpful; in managing her anxiety. MOB expressed that she also see's a therapist with [facility] once a month. MOB reported that she has no other mental health hx and expressed that she hasn't had any feelings of SI or HI.   CSW inquired from [facility] on who other supports are at this time in which MOB expressed that she has support from her spouse as well as her best friend. MOB informed CSW that she has all needed items to care for infant with plans for infant to be seen at [place]. MOB expressed expressed that she has been feeling "good" since giving birth and expressed no other needs.   CSW provided education regarding the baby blues period vs. perinatal mood disorders, discussed treatment and gave resources for mental health follow up if concerns arise.  CSW recommends self-evaluation during the postpartum time period using the New Mom Checklist from Postpartum Progress and encouraged MOB to contact a medical professional if symptoms are noted at any time.   CSW provided review of Sudden Infant Death Syndrome (SIDS) precautions.  MOB expressed that she has a basinet and crib for infant to sleep in once arrived home.  CSW identifies no further need for intervention and no barriers to discharge at this time.   Patricia Barber, MSW, LCSW [facility] [place] [facility] [place] [phone]

## 2020-07-02 NOTE — Discharge Summary (Signed)
Postpartum Discharge Summary   Patient Name: Patricia Barber DOB: Jan 13, 1991 MRN: [id number]  Date of admission: 07/02/2020 Delivery date:07/02/2020  Delivering provider: Marcille Buffy D  Date of discharge: 07/04/2020  Admitting diagnosis: Supervision of low-risk pregnancy, third trimester [Z34.93] Intrauterine pregnancy: [redacted]w[redacted]d    Secondary diagnosis:  Active Problems:   Supervision of low-risk pregnancy, third trimester   SVD (spontaneous vaginal delivery)  Additional problems: None    Discharge diagnosis: Term Pregnancy Delivered                                              Post partum procedures:none Augmentation: None Complications: None  Hospital course: Onset of Labor With Vaginal Delivery      29y.o. yo G3P1011 at 477w2das admitted in Active Labor on 07/02/2020. Patient had an uncomplicated labor course as follows:  Membrane Rupture Time/Date: 7:35 AM ,07/02/2020   Delivery Method:Vaginal, Spontaneous  Episiotomy: None  Lacerations:  2nd degree  Patient had an uncomplicated postpartum course.  She is ambulating, tolerating a regular diet, passing flatus, and urinating well. Patient is discharged home in stable condition on 07/04/20.  Newborn Data: Birth date:07/02/2020  Birth time:7:40 AM  Gender:Female  Living status:Living  Apgars:9 ,9  Weight:2946 g   Magnesium Sulfate received: No BMZ received: No Rhophylac:N/A MMR:Yes T-DaP:Given prenatally Flu: Yes- given prenatally  Transfusion:No  Physical exam  Vitals:   07/03/20 0530 07/03/20 1540 07/03/20 2110 07/04/20 0540  BP: 109/69 109/76 104/64 95/64  Pulse: 69 77 88 68  Resp: 17 18 17 18   Temp: 98.4 F (36.9 C) 99 F (37.2 C) 98.8 F (37.1 C) 98.4 F (36.9 C)  TempSrc: Oral Oral Oral Oral  SpO2: 99% 99% 98% 100%  Weight:      Height:       General: alert, cooperative and no distress Lochia: appropriate Uterine Fundus: firm Incision: N/A DVT Evaluation: No evidence of DVT seen on physical exam. No  cords or calf tenderness. No significant calf/ankle edema. Labs: Lab Results  Component Value Date   WBC 8.5 07/02/2020   HGB 11.0 (L) 07/02/2020   HCT 33.4 (L) 07/02/2020   MCV 93.0 07/02/2020   PLT 196 07/02/2020   CMP Latest Ref Rng & Units 04/27/2019  Glucose 65 - 99 mg/dL 86  BUN 7 - 25 mg/dL 14  Creatinine 0.50 - 1.10 mg/dL 0.84  Sodium 135 - 146 mmol/L 137  Potassium 3.5 - 5.3 mmol/L 4.1  Chloride 98 - 110 mmol/L 105  CO2 20 - 32 mmol/L 26  Calcium 8.6 - 10.2 mg/dL 9.2  Total Protein 6.1 - 8.1 g/dL 7.0  Total Bilirubin 0.2 - 1.2 mg/dL 0.5  AST 10 - 30 U/L 20  ALT 6 - 29 U/L 18   Edinburgh Score: Edinburgh Postnatal Depression Scale Screening Tool 07/03/2020  I have been able to laugh and see the funny side of things. 0  I have looked forward with enjoyment to things. 0  I have blamed myself unnecessarily when things went wrong. 1  I have been anxious or worried for no good reason. 1  I have felt scared or panicky for no good reason. 0  Things have been getting on top of me. 0  I have been so unhappy that I have had difficulty sleeping. 0  I have felt sad or miserable. 0  I  have been so unhappy that I have been crying. 0  The thought of harming myself has occurred to me. 0  Edinburgh Postnatal Depression Scale Total 2     After visit meds:  Allergies as of 07/04/2020      Reactions   Lactose Diarrhea, Nausea And Vomiting      Medication List    TAKE these medications   cholecalciferol 25 MCG (1000 UNIT) tablet Commonly known as: VITAMIN D3 Take 1,000 Units by mouth daily.   famotidine 20 MG tablet Commonly known as: Pepcid Take 1 tablet (20 mg total) by mouth 2 (two) times daily as needed for heartburn or indigestion.   ibuprofen 600 MG tablet Commonly known as: ADVIL Take 1 tablet (600 mg total) by mouth every 6 (six) hours as needed for moderate pain or cramping.   ketoconazole 2 % shampoo Commonly known as: NIZORAL Apply topically.    multivitamin-prenatal 27-0.8 MG Tabs tablet Take 1 tablet by mouth daily at 12 noon.   sertraline 50 MG tablet Commonly known as: ZOLOFT Take 50 mg by mouth daily.   Vitamin D (Ergocalciferol) 1.25 MG (50000 UNIT) Caps capsule Commonly known as: DRISDOL Take 50,000 Units by mouth once a week.        Discharge home in stable condition Infant Feeding: Breast Infant Disposition:home with mother Discharge instruction: per After Visit Summary and Postpartum booklet. Activity: Advance as tolerated. Pelvic rest for 6 weeks.  Diet: routine diet Future Appointments: No future appointments. Follow up Visit:  [place] [facility] [place] [facility] [place]. Schedule an appointment as soon as possible for a visit in 4 week(s).   Specialty: Obstetrics and Gynecology Why: Make appointment to be seen in 4 weeks for postpartum care  Contact information: [place], [place] [phone]               Please schedule this patient for Postpartum visit in: 4 weeks with the following provider: Any provider In-Person For C/S patients schedule nurse incision check in weeks 2 weeks: no Low risk pregnancy complicated by: none Delivery mode:  SVD Anticipated Birth Control:  other/unsure PP Procedures needed: none  Edinburgh: negative Schedule Integrated BH visit: no  No relevant baby issues    Lajean Manes, CNM 07/04/20, 7:06 AM

## 2020-07-03 MED ORDER — SERTRALINE HCL 50 MG PO TABS
50.0000 mg | ORAL_TABLET | Freq: Every day | ORAL | Status: DC
  Administered 2020-07-03 – 2020-07-04 (×2): 50 mg via ORAL
  Filled 2020-07-03 (×2): qty 1

## 2020-07-03 MED ORDER — MEASLES, MUMPS & RUBELLA VAC IJ SOLR
0.5000 mL | Freq: Once | INTRAMUSCULAR | Status: AC
  Administered 2020-07-04: 0.5 mL via SUBCUTANEOUS
  Filled 2020-07-03: qty 0.5

## 2020-07-03 NOTE — Lactation Note (Signed)
This note was copied from a baby's chart. Lactation Consultation Note Mom concerned earlier d/t baby had no interest in BF. RN had called LC a couple of times concerned about baby no feeding. RN stated baby has spit up several times.  Encouraged RN to have mom do STS, hand express and spoon feed.  When LC arrived to rm. Mom had baby on the breast BF great in sitting up cradle position.  Newborn feeding habits, behavior, STS, I&O, breast massage during feedings, positioning, supply and demand discussed.  Mom stated she has difficulty latching to Rt. Breast d/t nipple is flat. Shells given and hand pump for pre-pumping encouraged.  Mom has PCOS. Mom BF her daughter for 1 month d/t nipples burning so bad so mom just pumped and bottle feed for 10 months. Mom stated she had adequate supply of milk.  Encouraged to call for questions or concerns. Lactation brochure given. Reported to RN of baby feeding.  Patient Name: Boy Mattea Seger ASNKN'L Date: 07/03/2020 Reason for consult: Initial assessment;Term Type of Endocrine Disorder?: PCOS   Maternal Data Has patient been taught Hand Expression?: Yes Does the patient have breastfeeding experience prior to this delivery?: Yes  Feeding Feeding Type: Breast Fed  LATCH Score Latch: Grasps breast easily, tongue down, lips flanged, rhythmical sucking.  Audible Swallowing: None  Type of Nipple: Everted at rest and after stimulation  Comfort (Breast/Nipple): Soft / non-tender  Hold (Positioning): No assistance needed to correctly position infant at breast.  LATCH Score: 8  Interventions Interventions: Breast feeding basics reviewed;Support pillows;Position options;Skin to skin;Breast massage;Breast compression;Shells;Hand pump  Lactation Tools Discussed/Used WIC Program: No Pump Review: Setup, frequency, and cleaning;Milk Storage Initiated by:: RN Date initiated:: 07/02/20   Consult Status Consult Status: Follow-up Date:  07/03/20 Follow-up type: In-patient    Charyl Dancer 07/03/2020, 2:31 AM

## 2020-07-03 NOTE — Progress Notes (Addendum)
POSTPARTUM PROGRESS NOTE  Post Partum Day 1  Subjective:  Patricia Barber is a 29 y.o. [id number] s/p NSVD at [redacted]w[redacted]d.  She reports she is doing well. No acute events overnight. She denies any problems with ambulating, voiding or po intake. Denies nausea or vomiting.  Pain is well controlled.  Lochia is minimal.  Objective: Blood pressure 109/69, pulse 69, temperature 98.4 F (36.9 C), temperature source Oral, resp. rate 17, height 5\' 4"  (1.626 m), weight 92.5 kg, last menstrual period 09/16/2019, SpO2 99 %, unknown if currently breastfeeding.  Physical Exam:  General: alert, cooperative and no distress Chest: no respiratory distress Heart:regular rate, distal pulses intact Abdomen: soft, nontender,  Uterine Fundus: firm, appropriately tender DVT Evaluation: No calf swelling or tenderness Extremities: No LE edema Skin: warm, dry  Recent Labs    07/02/20 0412  HGB 11.0*  HCT 33.4*    Assessment/Plan: Patricia Barber is a 29 y.o. 37 s/p NSVD at [redacted]w[redacted]d   PPD#1 - Doing well  Routine postpartum care Contraception: undecided, prefers to wait Feeding: Breast  Anxiety: Continue sertraline 50 mg daily   Dispo: Plan for discharge tomorrow.   LOS: 1 day   [redacted]w[redacted]d, DO 07/03/2020, 6:13 AM PGY-2, [facility]  Attestation of Supervision of Student:  I confirm that I have verified the information documented in the  resident's  note and that I have also personally reperformed the history, physical exam and all medical decision making activities.  I have verified that all services and findings are accurately documented in this student's note; and I agree with management and plan as outlined in the documentation. I have also made any necessary editorial changes.  13/02/2020, MD Center for [facility], [facility] 07/03/2020 11:42 AM

## 2020-07-03 NOTE — Lactation Note (Addendum)
This note was copied from a baby's chart. Lactation Consultation Note  Patient Name: Patricia Barber MNOTR'R Date: 07/03/2020 Reason for consult: Follow-up assessment;Mother's request;Term;Infant weight loss Type of Endocrine Disorder?: PCOS  Infant is 40 weeks 34 hours old with 5% weight loss. Infant feeding at the breast 25-40 minutes with one feeding of 60 minutes. Mom experienced with breastfeeding and states infant was clustering at the time.   Infant last void 6 am and stool 9 am brown in color. Mom states infant is clamped pretty tight at the nipple and hard to get him to open wide.  Mom notes infant keeps falling asleep at the breast after a few minutes and then waking to feed multiple times going on and off the breast.   Infant has tight labial frenulum and a recessed chin. With suck training, he was able to extend his tongue pass the gum line.   Mom's nipples are flat but no signs of abrasions or edema. RN given Mom breast shells to wear to bring out her nipples. LC reviewed with Mom how to use them. Mom also has coconut oil for nipple care provided by RN.   LC observed a latch. Infant latches with ease but tends to get sleepy at the breast. LC helped Mom to break the latch when shallow and tugged on his chin to improve it. Mom will now compresses breast tissue in different areas to keep up flow so infant will stay active at the breast. Signs of milk transfer noted during the feed with stimulation. LC observed feeding for 10 minutes infant still active at the end of the visit.   Plan 1. Mom to feed based on cues 8-12 x in 24 hour period no more than 4 hours without an attempt. Mom to place infant STS use chin tug and ensure both lips are flanged at the breast.  2. Breastfeeding supplementation guide based on hours after birth reviewed with parents. Mom to supplement using EBM with either spoon or finger feeding w/ curve tip syringe based on guidelines.  3. Mom to pump with frequency of  q 3hours for 10 minutes using manual pump.  4. All questions answered during the visit. 5.LC alerted RN, Judeth Cornfield, feeding plan.   \Dad came to tell me infant voided 30 minutes after I left.

## 2020-07-03 NOTE — Lactation Note (Signed)
This note was copied from a baby's chart. Lactation Consultation Note  Patient Name: Patricia Barber    BUYZJ'Q Date: 07/03/2020   Mother sleeping and father cradling infant . Reports that infant has been cluster feeding. He would not like for mother to be awaken. LC Will follow up later.      Maternal Data    Feeding Feeding Type: Breast Fed  LATCH Score                   Interventions    Lactation Tools Discussed/Used     Consult Status      Michel Bickers 07/03/2020, 3:05 PM

## 2020-07-04 DIAGNOSIS — Z9889 Other specified postprocedural states: Secondary | ICD-10-CM

## 2020-07-04 MED ORDER — IBUPROFEN 600 MG PO TABS: 600.0000 mg | ORAL_TABLET | Freq: Four times a day (QID) | ORAL | 0 refills | Status: DC | PRN

## 2020-07-04 NOTE — Lactation Note (Addendum)
This note was copied from a baby's chart. Lactation Consultation Note  Patient Name: Patricia Barber HUDJS'H Date: 07/04/2020 Reason for consult: Follow-up assessment   P2, Baby 50 hours old.  < 6 lbs.  Baby has had one stool in the last 24 hours. Encouraged continuing to supplement with breastmilk and breastfeed. Mother attempting to latch but baby is sleepy. Suggest placing baby STS and if baby does not wake in the next hour, give supplemental breastmilk and wake to breastfeed. Mother recently pumped approx 5 ml.  Praised for her efforts. Mother has personal DEBP at home.  Suggest continuing to post pump and give volume back. Feed on demand with cues.  Goal 8-12+ times per day after first 24 hrs.  Place baby STS if not cueing.  Reviewed engorgement care and monitoring voids/stools.    Maternal Data    Feeding Feeding Type: Breast Fed  LATCH Score                   Interventions Interventions: Breast feeding basics reviewed  Lactation Tools Discussed/Used Tools: Nipple Shields Nipple shield size: 20 Flange Size: 24 Breast pump type: Manual   Consult Status Consult Status: Complete Date: 07/04/20    Patricia Barber [facility] 07/04/2020, 10:18 AM

## 2020-07-04 NOTE — Progress Notes (Signed)
Discussed with mom at bedside about circumcision.   Circumcision is a surgery that removes the skin that covers the tip of the penis, called the "foreskin." Circumcision is usually done when a boy is between 1 and 10 days old, sometimes up to 3-4 weeks old.  The most common reasons boys are circumcised include for cultural/religious beliefs or for parental preference (potentially easier to clean, so baby looks like daddy, etc).  There may be some medical benefits for circumcision:   Circumcised boys seem to have slightly lower rates of: ? Urinary tract infections (per the American Academy of Pediatrics an uncircumcised boy has a 1/100 chance of developing a UTI in the first year of life, a circumcised boy at a 08/998 chance of developing a UTI in the first year of life- a 10% reduction) ? Penis cancer (typically rare- an uncircumcised female has a 1 in 100,000 chance of developing cancer of the penis) ? Sexually transmitted infection (in endemic areas, including HIV, HPV and Herpes- circumcision does NOT protect against gonorrhea, chlamydia, trachomatis, or syphilis) ? Phimosis: a condition where that makes retraction of the foreskin over the glans impossible (0.4 per 1000 boys per year or 0.6% of boys are affected by their 15th birthday)  Boys and men who are not circumcised can reduce these extra risks by: ? Cleaning their penis well ? Using condoms during sex  What are the risks of circumcision?  As with any surgical procedure, there are risks and complications. In circumcision, complications are rare and usually minor, the most common being: ? Bleeding- risk is reduced by holding each clamp for 30 seconds prior to a cut being made, and by holding pressure after the procedure is done ? Infection- the penis is cleaned prior to the procedure, and the procedure is done under sterile technique ? Damage to the urethra or amputation of the penis  How is circumcision done in baby boys?  The baby  will be placed on a special table and the legs restrained for their safety. Numbing medication is injected into the penis, and the skin is cleansed with betadine to decrease the risk of infection.   What to expect:  The penis will look red and raw for 5-7 days as it heals. We expect scabbing around where the cut was made, as well as clear-pink fluid and some swelling of the penis right after the procedure. If your baby's circumcision starts to bleed or develops pus, please contact your pediatrician immediately.  All questions were answered and mother consented for the procedure.  

## 2020-07-05 ENCOUNTER — Encounter: Payer: BC Managed Care – PPO | Admitting: Advanced Practice Midwife

## 2020-07-08 ENCOUNTER — Encounter: Payer: BC Managed Care – PPO | Admitting: Certified Nurse Midwife

## 2020-07-11 DIAGNOSIS — F331 Major depressive disorder, recurrent, moderate: Secondary | ICD-10-CM | POA: Diagnosis not present

## 2020-07-25 DIAGNOSIS — M79671 Pain in right foot: Secondary | ICD-10-CM | POA: Diagnosis not present

## 2020-07-25 DIAGNOSIS — B070 Plantar wart: Secondary | ICD-10-CM | POA: Diagnosis not present

## 2020-07-25 DIAGNOSIS — M79672 Pain in left foot: Secondary | ICD-10-CM | POA: Diagnosis not present

## 2020-07-29 DIAGNOSIS — F321 Major depressive disorder, single episode, moderate: Secondary | ICD-10-CM | POA: Diagnosis not present

## 2020-08-02 ENCOUNTER — Other Ambulatory Visit: Payer: Self-pay

## 2020-08-02 ENCOUNTER — Ambulatory Visit (INDEPENDENT_AMBULATORY_CARE_PROVIDER_SITE_OTHER): Payer: BC Managed Care – PPO | Admitting: Advanced Practice Midwife

## 2020-08-02 ENCOUNTER — Encounter: Payer: Self-pay | Admitting: Advanced Practice Midwife

## 2020-08-02 VITALS — BP 115/75 | HR 69 | Resp 16 | Ht 64.0 in | Wt 199.0 lb

## 2020-08-02 DIAGNOSIS — R102 Pelvic and perineal pain: Secondary | ICD-10-CM | POA: Insufficient documentation

## 2020-08-02 DIAGNOSIS — G8929 Other chronic pain: Secondary | ICD-10-CM | POA: Insufficient documentation

## 2020-08-02 NOTE — Progress Notes (Signed)
Post Partum Visit Note  Patricia Barber is a 29 y.o. G75P2012 female who presents for a postpartum visit. She is 4 weeks postpartum following a normal spontaneous vaginal delivery.  I have fully reviewed the prenatal and intrapartum course. The delivery was at [redacted]w[redacted]d gestational weeks.  Anesthesia: none. Postpartum course has been unremarkable. Baby is doing well. Baby is feeding by breast. Bleeding no bleeding. Bowel function is normal. Bladder function is normal. Patient is not sexually active. Contraception method is undecided. Postpartum depression screening: neg   The pregnancy intention screening data noted above was reviewed. Potential methods of contraception were discussed. The patient elected to proceed with Abstinence.    Edinburgh Postnatal Depression Scale - 08/02/20 0842      Edinburgh Postnatal Depression Scale:  In the Past 7 Days   I have been able to laugh and see the funny side of things. 0    I have looked forward with enjoyment to things. 0    I have blamed myself unnecessarily when things went wrong. 1    I have been anxious or worried for no good reason. 1    I have felt scared or panicky for no good reason. 0    Things have been getting on top of me. 0    I have been so unhappy that I have had difficulty sleeping. 0    I have felt sad or miserable. 0    I have been so unhappy that I have been crying. 0    The thought of harming myself has occurred to me. 0    Edinburgh Postnatal Depression Scale Total 2            The following portions of the patient's history were reviewed and updated as appropriate: allergies, current medications, past family history, past medical history, past social history, past surgical history and problem list.  Review of Systems Pertinent items noted in HPI and remainder of comprehensive ROS otherwise negative.    Objective:  BP 115/75   Pulse 69   Resp 16   Ht 5\' 4"  (1.626 m)   Wt 199 lb (90.3 kg)   Breastfeeding Yes   BMI  34.16 kg/m    VS reviewed, nursing note reviewed,  Constitutional: well developed, well nourished, no distress HEENT: normocephalic CV: normal rate Pulm/chest wall: normal effort Abdomen: soft Neuro: alert and oriented x 3 Skin: warm, dry Psych: affect normal  Assessment:   1. Chronic pelvic pain in female --Pt reports pain after intercourse prior to her 2 pregnancies.   - Ambulatory referral to Physical Therapy  2. Postpartum care following vaginal delivery --Pt doing well, bonding well with baby, good support at home  3. Encounter for care and examination of lactating mother --Pt pumping exclusively right now.  Pumped exclusively x 9 months with first child but it is difficult.  Thinking about supplementing at night so she can sleep more instead of pumping.  Discussed options, and pt would like to see lactation, work on latch so some feeding can be done at the breast, less pumping.  Discussed increased pumping/adding latched feeding x several days in a row to increase milk supply.  --Referral made to lactation  Plan:   Essential components of care per ACOG recommendations:  1.  Mood and well being: Patient with negative depression screening today. Reviewed local resources for support.  - Patient does not use tobacco.  - hx of drug use? No    2. Infant care and  feeding:  -Patient currently breastmilk feeding? Yes  If breastmilk feeding discussed return to work and pumping. If needed, patient was provided letter for work to allow for every 2-3 hr pumping breaks, and to be granted a private location to express breastmilk and refrigerated area to store breastmilk. Reviewed importance of draining breast regularly to support lactation. -Social determinants of health (SDOH) reviewed in EPIC. No concerns  3. Sexuality, contraception and birth spacing - Patient does not want a pregnancy in the next year.   - Reviewed forms of contraception in tiered fashion. Patient desired  abstinence for now, thinking about IUD in a few weeks - Discussed birth spacing of 18 months  4. Sleep and fatigue -Encouraged family/partner/community support of 4 hrs of uninterrupted sleep to help with mood and fatigue  5. Physical Recovery  - Discussed patients delivery without complication - Patient had a second degree laceration, perineal healing reviewed. Patient expressed understanding - Patient has urinary incontinence? No  But pt reported pelvic pain, especially after intercourse for years.   Patient was referred to PT  - Patient is safe to resume physical and sexual activity but plans to wait until contraceptive visit, scheduled in 2 weeks.   6.  Health Maintenance - Last pap smear done 04/20/19 and was normal    Sharen Counter, CNM Center for [employer], [facility]

## 2020-08-24 ENCOUNTER — Ambulatory Visit: Payer: BC Managed Care – PPO | Admitting: Advanced Practice Midwife

## 2020-08-24 ENCOUNTER — Encounter: Payer: Self-pay | Admitting: Advanced Practice Midwife

## 2020-08-24 ENCOUNTER — Other Ambulatory Visit: Payer: Self-pay

## 2020-08-24 VITALS — BP 108/73 | HR 79 | Resp 16 | Ht 64.0 in | Wt 200.0 lb

## 2020-08-24 DIAGNOSIS — Z30430 Encounter for insertion of intrauterine contraceptive device: Secondary | ICD-10-CM

## 2020-08-24 DIAGNOSIS — Z3202 Encounter for pregnancy test, result negative: Secondary | ICD-10-CM | POA: Diagnosis not present

## 2020-08-24 LAB — POCT URINE PREGNANCY: Preg Test, Ur: NEGATIVE

## 2020-08-24 MED ORDER — LEVONORGESTREL 20 MCG/24HR IU IUD
INTRAUTERINE_SYSTEM | Freq: Once | INTRAUTERINE | Status: AC
Start: 2020-08-24 — End: 2020-08-24

## 2020-08-24 NOTE — Progress Notes (Addendum)
GYNECOLOGY OFFICE PROCEDURE NOTE  Patricia Barber is a 29 y.o. [id number] here for Mirena IUD insertion. No GYN concerns.  Last pap smear was on 04/20/2019 and was normal.  IUD Insertion Procedure Note Patient identified, informed consent performed, consent signed.   Discussed risks of irregular bleeding, cramping, infection, malpositioning or misplacement of the IUD outside the uterus which may require further procedure such as laparoscopy. Time out was performed.  Urine pregnancy test negative.  Speculum placed in the vagina.  Cervix visualized.  Cleaned with Betadine x 2.  Grasped anteriorly with a single tooth tenaculum.  Uterus sounded to 8.5 cm.  Mirena IUD placed per manufacturer's recommendations.  Strings trimmed to 3-4 cm. Tenaculum was removed, good hemostasis noted.  Patient tolerated procedure well.   Patient was given post-procedure instructions.  She was advised to have backup contraception for one week.  Patient was also asked to check IUD strings periodically and follow up in 4 weeks for IUD check.  Lot #: [id number] Exp:  Oct 2023  Sharen Counter, CNM 11:33 AM

## 2020-08-24 NOTE — Addendum Note (Signed)
Addended by: Sharen Counter A on: 08/24/2020 11:34 AM   Modules accepted: Level of Service

## 2020-08-24 NOTE — Patient Instructions (Signed)

## 2020-09-07 DIAGNOSIS — F321 Major depressive disorder, single episode, moderate: Secondary | ICD-10-CM | POA: Diagnosis not present

## 2020-09-14 DIAGNOSIS — M5388 Other specified dorsopathies, sacral and sacrococcygeal region: Secondary | ICD-10-CM | POA: Diagnosis not present

## 2020-09-14 DIAGNOSIS — M609 Myositis, unspecified: Secondary | ICD-10-CM | POA: Diagnosis not present

## 2020-09-14 DIAGNOSIS — M953 Acquired deformity of neck: Secondary | ICD-10-CM | POA: Diagnosis not present

## 2020-09-14 DIAGNOSIS — M47812 Spondylosis without myelopathy or radiculopathy, cervical region: Secondary | ICD-10-CM | POA: Diagnosis not present

## 2020-09-19 DIAGNOSIS — M5388 Other specified dorsopathies, sacral and sacrococcygeal region: Secondary | ICD-10-CM | POA: Diagnosis not present

## 2020-09-19 DIAGNOSIS — M609 Myositis, unspecified: Secondary | ICD-10-CM | POA: Diagnosis not present

## 2020-09-19 DIAGNOSIS — M47812 Spondylosis without myelopathy or radiculopathy, cervical region: Secondary | ICD-10-CM | POA: Diagnosis not present

## 2020-09-19 DIAGNOSIS — M953 Acquired deformity of neck: Secondary | ICD-10-CM | POA: Diagnosis not present

## 2020-09-21 ENCOUNTER — Ambulatory Visit: Payer: BC Managed Care – PPO | Admitting: Obstetrics and Gynecology

## 2020-09-21 DIAGNOSIS — M5388 Other specified dorsopathies, sacral and sacrococcygeal region: Secondary | ICD-10-CM | POA: Diagnosis not present

## 2020-09-21 DIAGNOSIS — M47812 Spondylosis without myelopathy or radiculopathy, cervical region: Secondary | ICD-10-CM | POA: Diagnosis not present

## 2020-09-21 DIAGNOSIS — M609 Myositis, unspecified: Secondary | ICD-10-CM | POA: Diagnosis not present

## 2020-09-21 DIAGNOSIS — M953 Acquired deformity of neck: Secondary | ICD-10-CM | POA: Diagnosis not present

## 2020-09-22 DIAGNOSIS — M47812 Spondylosis without myelopathy or radiculopathy, cervical region: Secondary | ICD-10-CM | POA: Diagnosis not present

## 2020-09-22 DIAGNOSIS — M953 Acquired deformity of neck: Secondary | ICD-10-CM | POA: Diagnosis not present

## 2020-09-22 DIAGNOSIS — M609 Myositis, unspecified: Secondary | ICD-10-CM | POA: Diagnosis not present

## 2020-09-22 DIAGNOSIS — M5388 Other specified dorsopathies, sacral and sacrococcygeal region: Secondary | ICD-10-CM | POA: Diagnosis not present

## 2020-09-26 DIAGNOSIS — M47812 Spondylosis without myelopathy or radiculopathy, cervical region: Secondary | ICD-10-CM | POA: Diagnosis not present

## 2020-09-26 DIAGNOSIS — M5388 Other specified dorsopathies, sacral and sacrococcygeal region: Secondary | ICD-10-CM | POA: Diagnosis not present

## 2020-09-26 DIAGNOSIS — M953 Acquired deformity of neck: Secondary | ICD-10-CM | POA: Diagnosis not present

## 2020-09-26 DIAGNOSIS — M609 Myositis, unspecified: Secondary | ICD-10-CM | POA: Diagnosis not present

## 2020-09-27 ENCOUNTER — Ambulatory Visit (INDEPENDENT_AMBULATORY_CARE_PROVIDER_SITE_OTHER): Payer: BC Managed Care – PPO | Admitting: Advanced Practice Midwife

## 2020-09-27 ENCOUNTER — Encounter: Payer: Self-pay | Admitting: Advanced Practice Midwife

## 2020-09-27 ENCOUNTER — Other Ambulatory Visit: Payer: Self-pay

## 2020-09-27 VITALS — BP 116/71 | HR 81 | Ht 64.0 in | Wt 206.0 lb

## 2020-09-27 DIAGNOSIS — Z30431 Encounter for routine checking of intrauterine contraceptive device: Secondary | ICD-10-CM | POA: Diagnosis not present

## 2020-09-27 DIAGNOSIS — Z975 Presence of (intrauterine) contraceptive device: Secondary | ICD-10-CM | POA: Diagnosis not present

## 2020-09-27 NOTE — Progress Notes (Signed)
° °  GYNECOLOGY CLINIC PROGRESS NOTE  History:  30 y.o. [id number] here at [facility] today for today for IUD string check; Mirena IUD was placed  08/24/20. No complaints about the IUD, no concerning side effects.  Some frequent light bleeding, pt does not desire treatment.  The following portions of the patient's history were reviewed and updated as appropriate: allergies, current medications, past family history, past medical history, past social history, past surgical history and problem list. Last pap smear on 04/17/19 was normal.  Review of Systems:  Pertinent items are noted in HPI.   Objective:  Physical Exam Blood pressure 116/71, pulse 81, height 5\' 4"  (1.626 m), weight 206 lb (93.4 kg), currently breastfeeding. Gen: NAD Abd: Soft, nontender and nondistended Pelvic: Normal appearing external genitalia; normal appearing vaginal mucosa and cervix.  IUD strings visualized, about 3 cm in length outside cervix.   Assessment & Plan:  Normal IUD check. Patient to keep IUD in place for 5-7 years; can come in for removal if she desires pregnancy within the next 7 years. Routine preventative health maintenance measures emphasized. --F/U scheduled in 2 months to discuss breakthrough bleeding. Pt does not want treatment today but will evaluate if bleeding persists.  , CNM 1:31 PM

## 2020-09-28 DIAGNOSIS — M5388 Other specified dorsopathies, sacral and sacrococcygeal region: Secondary | ICD-10-CM | POA: Diagnosis not present

## 2020-09-28 DIAGNOSIS — M47812 Spondylosis without myelopathy or radiculopathy, cervical region: Secondary | ICD-10-CM | POA: Diagnosis not present

## 2020-09-28 DIAGNOSIS — M953 Acquired deformity of neck: Secondary | ICD-10-CM | POA: Diagnosis not present

## 2020-09-28 DIAGNOSIS — M609 Myositis, unspecified: Secondary | ICD-10-CM | POA: Diagnosis not present

## 2020-09-29 DIAGNOSIS — M5388 Other specified dorsopathies, sacral and sacrococcygeal region: Secondary | ICD-10-CM | POA: Diagnosis not present

## 2020-09-29 DIAGNOSIS — M609 Myositis, unspecified: Secondary | ICD-10-CM | POA: Diagnosis not present

## 2020-09-29 DIAGNOSIS — M47812 Spondylosis without myelopathy or radiculopathy, cervical region: Secondary | ICD-10-CM | POA: Diagnosis not present

## 2020-09-29 DIAGNOSIS — M953 Acquired deformity of neck: Secondary | ICD-10-CM | POA: Diagnosis not present

## 2020-10-10 DIAGNOSIS — M79672 Pain in left foot: Secondary | ICD-10-CM | POA: Diagnosis not present

## 2020-10-10 DIAGNOSIS — M79671 Pain in right foot: Secondary | ICD-10-CM | POA: Diagnosis not present

## 2020-10-10 DIAGNOSIS — B070 Plantar wart: Secondary | ICD-10-CM | POA: Diagnosis not present

## 2020-10-11 DIAGNOSIS — M47812 Spondylosis without myelopathy or radiculopathy, cervical region: Secondary | ICD-10-CM | POA: Diagnosis not present

## 2020-10-11 DIAGNOSIS — M609 Myositis, unspecified: Secondary | ICD-10-CM | POA: Diagnosis not present

## 2020-10-11 DIAGNOSIS — M953 Acquired deformity of neck: Secondary | ICD-10-CM | POA: Diagnosis not present

## 2020-10-11 DIAGNOSIS — M5388 Other specified dorsopathies, sacral and sacrococcygeal region: Secondary | ICD-10-CM | POA: Diagnosis not present

## 2020-10-13 DIAGNOSIS — F331 Major depressive disorder, recurrent, moderate: Secondary | ICD-10-CM | POA: Diagnosis not present

## 2020-10-13 DIAGNOSIS — M953 Acquired deformity of neck: Secondary | ICD-10-CM | POA: Diagnosis not present

## 2020-10-13 DIAGNOSIS — M47812 Spondylosis without myelopathy or radiculopathy, cervical region: Secondary | ICD-10-CM | POA: Diagnosis not present

## 2020-10-13 DIAGNOSIS — M5388 Other specified dorsopathies, sacral and sacrococcygeal region: Secondary | ICD-10-CM | POA: Diagnosis not present

## 2020-10-13 DIAGNOSIS — M609 Myositis, unspecified: Secondary | ICD-10-CM | POA: Diagnosis not present

## 2020-10-17 DIAGNOSIS — F321 Major depressive disorder, single episode, moderate: Secondary | ICD-10-CM | POA: Diagnosis not present

## 2020-10-19 DIAGNOSIS — M5388 Other specified dorsopathies, sacral and sacrococcygeal region: Secondary | ICD-10-CM | POA: Diagnosis not present

## 2020-10-19 DIAGNOSIS — M953 Acquired deformity of neck: Secondary | ICD-10-CM | POA: Diagnosis not present

## 2020-10-19 DIAGNOSIS — M47812 Spondylosis without myelopathy or radiculopathy, cervical region: Secondary | ICD-10-CM | POA: Diagnosis not present

## 2020-10-19 DIAGNOSIS — M609 Myositis, unspecified: Secondary | ICD-10-CM | POA: Diagnosis not present

## 2020-10-24 ENCOUNTER — Encounter: Payer: Self-pay | Admitting: Physical Therapy

## 2020-10-24 ENCOUNTER — Ambulatory Visit: Payer: BC Managed Care – PPO | Attending: Advanced Practice Midwife | Admitting: Physical Therapy

## 2020-10-24 ENCOUNTER — Other Ambulatory Visit: Payer: Self-pay

## 2020-10-24 DIAGNOSIS — R252 Cramp and spasm: Secondary | ICD-10-CM | POA: Diagnosis not present

## 2020-10-24 DIAGNOSIS — R279 Unspecified lack of coordination: Secondary | ICD-10-CM | POA: Diagnosis not present

## 2020-10-24 NOTE — Patient Instructions (Signed)
Access Code: [email or web address] URL: [email or web address] Date: 10/24/2020 Prepared by: Dwana Curd  Exercises Supine Diaphragmatic Breathing - 3 x daily - 7 x weekly - 1 sets - 10 reps Supine Butterfly Groin Stretch - 1 x daily - 7 x weekly - 1 sets - 3 reps - 30 sec hold Supine Pelvic Floor Stretch - Hands on Knees - 1 x daily - 7 x weekly - 1 sets - 3 reps - 30 hold

## 2020-10-24 NOTE — Therapy (Signed)
[facility] [address], [address] [place], [place], [address] Phone: [phone]   Fax:  [phone]  Physical Therapy Evaluation  Patient Details  Name: Patricia Barber MRN: [id number] Date of Birth: 1991-03-26 Referring Provider (PT): Hurshel Party, CNM   Encounter Date: 10/24/2020   PT End of Session - 10/24/20 1708    Visit Number 1    Date for PT Re-Evaluation 01/16/21    Authorization Type BCBS    PT Start Time 1534    PT Stop Time 1615    PT Time Calculation (min) 41 min    Activity Tolerance Patient tolerated treatment well    Behavior During Therapy [facility] for tasks assessed/performed           Past Medical History:  Diagnosis Date  . Anxiety   . Headache     Past Surgical History:  Procedure Laterality Date  . WISDOM TOOTH EXTRACTION      There were no vitals filed for this visit.    Subjective Assessment - 10/24/20 1538    Subjective Pt states she has pain with intercourse and usually happens afterwards and is lower abdominal pain. Pt has been having more pain recently when using the diva cup    Patient Stated Goals not have pain with intercourse; use diva cup without pain    Currently in Pain? Yes    Pain Score 4     Pain Location Abdomen    Pain Orientation Mid;Lower    Pain Descriptors / Indicators Aching;Discomfort    Pain Type Chronic pain    Pain Frequency Intermittent    Aggravating Factors  intercourse and using diva cup    Pain Relieving Factors a couple day it goes away    Multiple Pain Sites No              [facility] PT Assessment - 10/24/20 0001      Assessment   Medical Diagnosis R10.2,G89.29 (ICD-10-CM) - Chronic pelvic pain in female    Referring Provider (PT) Leftwich-Kirby, Wilmer Floor, CNM    Onset Date/Surgical Date --   since sexually active has had pain, but is more frequent now   Prior Therapy no      Precautions   Precautions None      Restrictions   Weight Bearing  Restrictions No      Balance Screen   Has the patient fallen in the past 6 months No      Home Environment   Living Environment Private residence    Living Arrangements Spouse/significant other;Children;Other relatives   mother in law     Prior Function   Level of Independence Independent    Vocation Full time employment    Vocation Requirements sedentary and sitting    Leisure walking      Functional Tests   Functional tests Single leg stance      Single Leg Stance   Comments Rt trendelenburg      Posture/Postural Control   Posture/Postural Control Postural limitations    Postural Limitations Anterior pelvic tilt;Decreased lumbar lordosis      ROM / Strength   AROM / PROM / Strength AROM;PROM;Strength      AROM   Overall AROM Comments lumbar 50%      PROM   Overall PROM Comments hip flexion 75%      Strength   Overall Strength Comments hips and LE 5/5; core weakness lower abdomen      Flexibility   Soft  Tissue Assessment /Muscle Length yes    Hamstrings normal      Palpation   SI assessment  normal    Palpation comment diastasis rectus abdominus 1/2 finger from umbillicus and down      Ambulation/Gait   Gait Pattern Within Functional Limits                      Objective measurements completed on examination: See above findings.     Pelvic Floor Special Questions - 10/24/20 0001    Are you Pregnant or attempting pregnancy? No    Prior Pregnancies Yes    Number of Pregnancies 2    Number of Vaginal Deliveries 2   2nd degree   Currently Sexually Active Yes    Is this Painful Yes    Marinoff Scale pain prevents any attempts at intercourse   sometimes up to 3/3   Urinary Leakage Yes    How often occasionally    Pad use no    Activities that cause leaking With strong urge;Laughing    Urinary urgency Yes    Fecal incontinence No    Fluid intake 100 oz    Falling out feeling (prolapse) No    Perineal Body/Introitus  Elevated    Prolapse  None    Pelvic Floor Internal Exam pt identity confirmed and informed consent given to perform internal assessment    Exam Type Vaginal    Palpation Rt rectus abdominal attatchment TTP; Rt posterior >Lt; bilateral levators high tone and tight    Strength weak squeeze, no lift    Strength # of reps 1    Strength # of seconds 2    Tone high            [facility] Adult PT Treatment/Exercise - 10/24/20 0001      Self-Care   Self-Care Other Self-Care Comments    Other Self-Care Comments  intial HEP Access Code: [email or web address]                  PT Education - 10/24/20 1700    Education Details Access Code: [email or web address]    Person(s) Educated Patient    Methods Explanation;Demonstration;Verbal cues;Tactile cues;Handout    Comprehension Verbalized understanding;Returned demonstration            PT Short Term Goals - 10/24/20 1625      PT SHORT TERM GOAL #1   Title ind with initial HEP    Time 4    Period Weeks    Status New    Target Date 11/21/20      PT SHORT TERM GOAL #2   Title Pt will report 25% less pain with intercourse    Time 4    Period Weeks    Status New    Target Date 11/21/20             PT Long Term Goals - 10/24/20 1623      PT LONG TERM GOAL #1   Title Pt will report at least 75% less frequency of pain with intercourse    Baseline has been increasing in frequency    Time 12    Period Weeks    Status New    Target Date 01/16/21      PT LONG TERM GOAL #2   Title Pt will report 2/10 pain at the most    Baseline 4/10    Time 12    Period Weeks    Status New  Target Date 01/16/21      PT LONG TERM GOAL #3   Title Pt will be in with advanced HEP for improved posture and management of low back pain    Time 12    Period Weeks    Status New    Target Date 01/16/21      PT LONG TERM GOAL #4   Title Pt will be able to use diva cup without pain    Time 8    Period Weeks    Status New    Target Date 01/16/21                  Plan -  10/24/20 1626    Clinical Impression Statement Pt presents to skilled PT due to chronic pelvic pain that has worsened since giving birth to her recent child in November.  Pt has increased pronation of the Rt foot and tendelenburg Rt hip in single leg standing.  Pt has increased anterior pelvic rotation.  Pt has abdominal wall weakness with small diastasis 1/2 finger from umbilicus down.  Pt has icreased tension through lumbar, hamstrings, diaphragm and upper traps.  She breathes using accessory muscles and limits ribcage excursion but was able to do with VC and TC.  Pt has a lot of difficulty activating pelvic floor muscles and then unable to completely relax without excessive time. Strength is 2/5 after a slow onset and holding breath, then she can hold for 2 seconds.  Unable to do quick flicks due to lag time.  There was a lot of tension and TTP in posterior pelvic floor and around scar tissue posterior vaginal canal.  Pt will benefit from skilled PT to address impairments and return to maximum functional activities without pain.    Personal Factors and Comorbidities Time since onset of injury/illness/exacerbation    Examination-Activity Limitations Sit;Continence    Examination-Participation Restrictions Interpersonal Relationship    Stability/Clinical Decision Making Evolving/Moderate complexity    Clinical Decision Making Moderate    Rehab Potential Excellent    PT Frequency 1x / week    PT Duration 12 weeks    PT Treatment/Interventions ADLs/Self Care Home Management;Biofeedback;Cryotherapy;Electrical Stimulation;Moist Heat;Therapeutic activities;Therapeutic exercise;Neuromuscular re-education;Patient/family education;Manual techniques;Taping;Dry needling;Passive range of motion    PT Next Visit Plan internal STM or biofeedback down training, lumbar, adductor, hamstring stretches, trunk lean of qped stretch and isolated pelvic floor contractions    PT Home Exercise Plan Access Code: [email or web address]     Consulted and Agree with Plan of Care Patient           Patient will benefit from skilled therapeutic intervention in order to improve the following deficits and impairments:  Pain,Postural dysfunction,Impaired flexibility,Decreased strength,Decreased coordination,Increased muscle spasms  Visit Diagnosis: Unspecified lack of coordination - Plan: PT plan of care cert/re-cert  Cramp and spasm - Plan: PT plan of care cert/re-cert     Problem List Patient Active Problem List   Diagnosis Date Noted  . IUD (intrauterine device) in place 09/27/2020  . Chronic pelvic pain in female 08/02/2020  . PCOS (polycystic ovarian syndrome) 04/20/2019    Junious Silk, PT 10/24/2020, 5:16 PM  [facility] [address], [address] [place], [place], [address] Phone: [phone]   Fax:  [phone]  Name: Patricia Barber MRN: [id number] Date of Birth: February 09, 1991

## 2020-10-25 DIAGNOSIS — M609 Myositis, unspecified: Secondary | ICD-10-CM | POA: Diagnosis not present

## 2020-10-25 DIAGNOSIS — M5388 Other specified dorsopathies, sacral and sacrococcygeal region: Secondary | ICD-10-CM | POA: Diagnosis not present

## 2020-10-25 DIAGNOSIS — M47812 Spondylosis without myelopathy or radiculopathy, cervical region: Secondary | ICD-10-CM | POA: Diagnosis not present

## 2020-10-25 DIAGNOSIS — M953 Acquired deformity of neck: Secondary | ICD-10-CM | POA: Diagnosis not present

## 2020-10-31 ENCOUNTER — Ambulatory Visit: Payer: BC Managed Care – PPO | Attending: Advanced Practice Midwife | Admitting: Physical Therapy

## 2020-10-31 ENCOUNTER — Other Ambulatory Visit: Payer: Self-pay

## 2020-10-31 DIAGNOSIS — R279 Unspecified lack of coordination: Secondary | ICD-10-CM | POA: Insufficient documentation

## 2020-10-31 DIAGNOSIS — R252 Cramp and spasm: Secondary | ICD-10-CM | POA: Insufficient documentation

## 2020-10-31 NOTE — Therapy (Signed)
[facility] [address], [address] [place], [place], [address] Phone: [phone]   Fax:  [phone]  Physical Therapy Treatment  Patient Details  Name: Patricia Barber MRN: [id number] Date of Birth: 04/05/1991 Referring Provider (PT): Hurshel Party, CNM   Encounter Date: 10/31/2020   PT End of Session - 10/31/20 1037    Visit Number 2    Date for PT Re-Evaluation 01/16/21    Authorization Type BCBS    PT Start Time 1021    PT Stop Time 1100    PT Time Calculation (min) 39 min    Activity Tolerance Patient tolerated treatment well    Behavior During Therapy [name] for tasks assessed/performed           Past Medical History:  Diagnosis Date  . Anxiety   . Headache     Past Surgical History:  Procedure Laterality Date  . WISDOM TOOTH EXTRACTION      There were no vitals filed for this visit.   Subjective Assessment - 10/31/20 1104    Subjective I could feel the what the pelvic floor was doing more when I did the breathing.  I think my back is better.  Have not had intercourse    Patient Stated Goals not have pain with intercourse; use diva cup without pain    Currently in Pain? No/denies                             [facility] Adult PT Treatment/Exercise - 10/31/20 0001      Neuro Re-ed    Neuro Re-ed Details  breathing and core/pelvic floor bulge and contract - cued for different depending on the exercise      Exercises   Exercises Lumbar      Lumbar Exercises: Stretches   Active Hamstring Stretch Right;Left;30 seconds    Figure 4 Stretch 2 reps;20 seconds    Other Lumbar Stretch Exercise adductor stretch with strap; child pose with SB and threading      Lumbar Exercises: Supine   Bent Knee Raise 10 reps   UE 10x; LE 10x   Bridge 10 reps      Lumbar Exercises: Quadruped   Madcat/Old Horse 5 reps    Single Arm Raise 5 reps    Other Quadruped Lumbar Exercises kegel in qped                     PT Short Term Goals - 10/31/20 1032      PT SHORT TERM GOAL #1   Title ind with initial HEP    Status Achieved      PT SHORT TERM GOAL #2   Title Pt will report 25% less pain with intercourse    Baseline has not had intercourse    Status On-going             PT Long Term Goals - 10/24/20 1623      PT LONG TERM GOAL #1   Title Pt will report at least 75% less frequency of pain with intercourse    Baseline has been increasing in frequency    Time 12    Period Weeks    Status New    Target Date 01/16/21      PT LONG TERM GOAL #2   Title Pt will report 2/10 pain at the most    Baseline 4/10    Time 12  Period Weeks    Status New    Target Date 01/16/21      PT LONG TERM GOAL #3   Title Pt will be in with advanced HEP for improved posture and management of low back pain    Time 12    Period Weeks    Status New    Target Date 01/16/21      PT LONG TERM GOAL #4   Title Pt will be able to use diva cup without pain    Time 8    Period Weeks    Status New    Target Date 01/16/21                 Plan - 10/31/20 1038    Clinical Impression Statement Pt has made improvement with ability to feel that she is contracting and relaxing the pelvic floor muscles more.  Pt was given porgression of exercises and stretches for imporved core strength.  She did well with basic exercises today.  She is ind with initial HEP. Pt will benefit from skilled therapy to continue with strength and coordination.    PT Treatment/Interventions ADLs/Self Care Home Management;Biofeedback;Cryotherapy;Electrical Stimulation;Moist Heat;Therapeutic activities;Therapeutic exercise;Neuromuscular re-education;Patient/family education;Manual techniques;Taping;Dry needling;Passive range of motion    PT Next Visit Plan f/u with goals, core strength and add gluteal strength Rt side focus    PT Home Exercise Plan Access Code: [email or web address]    Consulted and Agree with Plan of Care  Patient           Patient will benefit from skilled therapeutic intervention in order to improve the following deficits and impairments:  Pain,Postural dysfunction,Impaired flexibility,Decreased strength,Decreased coordination,Increased muscle spasms  Visit Diagnosis: Unspecified lack of coordination  Cramp and spasm     Problem List Patient Active Problem List   Diagnosis Date Noted  . IUD (intrauterine device) in place 09/27/2020  . Chronic pelvic pain in female 08/02/2020  . PCOS (polycystic ovarian syndrome) 04/20/2019    Junious Silk, PT 10/31/2020, 11:05 AM  [facility] [address] [place], [place], [address] Phone: [phone]   Fax:  [phone]  Name: Patricia Barber MRN: [id number] Date of Birth: [birth year]

## 2020-11-01 DIAGNOSIS — M47812 Spondylosis without myelopathy or radiculopathy, cervical region: Secondary | ICD-10-CM | POA: Diagnosis not present

## 2020-11-01 DIAGNOSIS — M5388 Other specified dorsopathies, sacral and sacrococcygeal region: Secondary | ICD-10-CM | POA: Diagnosis not present

## 2020-11-01 DIAGNOSIS — M609 Myositis, unspecified: Secondary | ICD-10-CM | POA: Diagnosis not present

## 2020-11-01 DIAGNOSIS — M953 Acquired deformity of neck: Secondary | ICD-10-CM | POA: Diagnosis not present

## 2020-11-09 ENCOUNTER — Telehealth: Payer: Self-pay | Admitting: *Deleted

## 2020-11-09 NOTE — Telephone Encounter (Signed)
Left patient a message to call and schedule F/U appointment from Wait List with Misty Stanley around 11/25/20.

## 2020-11-11 ENCOUNTER — Encounter: Payer: Self-pay | Admitting: Physical Therapy

## 2020-11-11 ENCOUNTER — Other Ambulatory Visit: Payer: Self-pay

## 2020-11-11 ENCOUNTER — Ambulatory Visit: Payer: BC Managed Care – PPO | Admitting: Physical Therapy

## 2020-11-11 DIAGNOSIS — R252 Cramp and spasm: Secondary | ICD-10-CM

## 2020-11-11 DIAGNOSIS — R279 Unspecified lack of coordination: Secondary | ICD-10-CM | POA: Diagnosis not present

## 2020-11-11 NOTE — Patient Instructions (Addendum)
Moisturizers . They are used in the vagina to hydrate the mucous membrane that make up the vaginal canal. . Designed to keep a more normal acid balance (ph) . Once placed in the vagina, it will last between two to three days.  . Use 2-3 times per week at bedtime  . Ingredients to avoid is glycerin and fragrance, can increase chance of infection . Should not be used just before sex due to causing irritation . Most are gels administered either in a tampon-shaped applicator or as a vaginal suppository. They are non-hormonal.   Types of Moisturizers(internal use)  . Vitamin E vaginal suppositories- Whole foods, Amazon . Moist Again . Coconut oil- can break down condoms . Julva- (Do no use if on Tamoxifen) amazon . Yes moisturizer- amazon . NeuEve Silk , NeuEve Silver for menopausal or over 65 (if have severe vaginal atrophy or cancer treatments use NeuEve Silk for  1 month than move to NeuEve Silver)- Amazon, Neuve.com . Olive and Bee intimate cream- www.oliveandbee.com.au . Mae vaginal moisturizer- Amazon . Aloe .    Creams to use externally on the Vulva area  Desert Harvest Releveum (good for for cancer patients that had radiation to the area)- amazon or www.desertharvest.com  V-magic cream - amazon  Julva-amazon  Vital "V Wild Yam salve ( help moisturize and help with thinning vulvar area, does have Beeswax  MoodMaid Botanical Pro-Meno Wild Yam Cream- Amazon  Desert Harvest Gele  Cleo by Damiva labial moisturizer (Amazon,   Coconut or olive oil  aloe   Things to avoid in the vaginal area . Do not use things to irritate the vulvar area . No lotions just specialized creams for the vulva area- Neogyn, V-magic, No soaps; can use Aveeno or Calendula cleanser if needed. Must be gentle . No deodorants . No douches . Good to sleep without underwear to let the vaginal area to air out . No scrubbing: spread the lips to let warm water rinse over labias and pat dry  STRETCHING  THE PELVIC FLOOR MUSCLES NO DILATOR  Supplies . Vaginal lubricant . Mirror (optional) . Gloves (optional) or clean hands Positioning . Start in a semi-reclined position with your head propped up. Bend your knees and place your thumb or finger at the vaginal opening. Procedure . Apply a moderate amount of lubricant on the outer skin of your vagina, the labia minora.  Apply additional lubricant to your finger. . Spread the skin away from the vaginal opening. Place the end of your finger at the opening. . Do a maximum contraction of the pelvic floor muscles. Tighten the vagina and the anus maximally and relax. . When you know they are relaxed, gently and slowly insert your finger into your vagina, directing your finger slightly downward, for 2-3 inches of insertion. . Relax and stretch the 6 o'clock position . Hold each stretch for _30-60 seconds, no pain more than 3/10 . Repeat the stretching in the 4 o'clock and 8 o'clock positions. . Next gently move your finger in a "U" shape  several times.  . You can also enter a second finger to work to spread the vaginal opening wider from 3:00-6:00 and 6:00-9:00 or 3:00-9:00 . Perform daily or every other day . Once you have accomplished the techniques you may try them in standing with one foot resting on the tub, or in other positions.  This is a good stretch to do in the shower if you don't need to use lubricant.  This can be   done at 35 weeks or later in your pregnancy.    

## 2020-11-11 NOTE — Therapy (Signed)
[facility] [address] [place], [place], [address] Phone: [phone]   Fax:  [phone]  Physical Therapy Treatment  Patient Details  Name: Patricia Barber MRN: [id number] Date of Birth: Jan 12, 1991 Referring Provider (PT): Hurshel Party, CNM   Encounter Date: 11/11/2020   PT End of Session - 11/11/20 0905    Visit Number 3    Date for PT Re-Evaluation 01/16/21    Authorization Type BCBS    PT Start Time 0900   arrived late   PT Stop Time 0930    PT Time Calculation (min) 30 min    Activity Tolerance Patient tolerated treatment well    Behavior During Therapy [facility] for tasks assessed/performed           Past Medical History:  Diagnosis Date  . Anxiety   . Headache     Past Surgical History:  Procedure Laterality Date  . WISDOM TOOTH EXTRACTION      There were no vitals filed for this visit.   Subjective Assessment - 11/11/20 0904    Subjective I can feel the pelvic floor a lot when doing the bridges.  I haven't noticed back pain so I guess that is better.    Patient Stated Goals not have pain with intercourse; use diva cup without pain    Currently in Pain? No/denies                             [facility] Adult PT Treatment/Exercise - 11/11/20 0001      Self-Care   Other Self-Care Comments  self massage and moisturizers      Lumbar Exercises: Stretches   Other Lumbar Stretch Exercise lumbar stretch on foam roll BKTC and SKTC and rocking      Lumbar Exercises: Standing   Other Standing Lumbar Exercises walking with sports cord back and sides - 20 lb - 5 x each    Other Standing Lumbar Exercises bird dips      Lumbar Exercises: Quadruped   Other Quadruped Lumbar Exercises kegel with lean on table                  PT Education - 11/11/20 0940    Education Details moisturizers and self stretch    Person(s) Educated Patient    Methods  Explanation;Demonstration;Tactile cues;Verbal cues;Handout    Comprehension Verbalized understanding;Returned demonstration            PT Short Term Goals - 11/11/20 0901      PT SHORT TERM GOAL #1   Title ind with initial HEP    Status Achieved             PT Long Term Goals - 10/24/20 1623      PT LONG TERM GOAL #1   Title Pt will report at least 75% less frequency of pain with intercourse    Baseline has been increasing in frequency    Time 12    Period Weeks    Status New    Target Date 01/16/21      PT LONG TERM GOAL #2   Title Pt will report 2/10 pain at the most    Baseline 4/10    Time 12    Period Weeks    Status New    Target Date 01/16/21      PT LONG TERM GOAL #3   Title Pt will be in with advanced HEP for  improved posture and management of low back pain    Time 12    Period Weeks    Status New    Target Date 01/16/21      PT LONG TERM GOAL #4   Title Pt will be able to use diva cup without pain    Time 8    Period Weeks    Status New    Target Date 01/16/21                 Plan - 11/11/20 0907    Clinical Impression Statement Pt states the back pain has been better since she realized she hasn't noticed it.  Pt has not had the chance to test if she is having pelvic pain.  Able to progress strength today.  Today's session was short due to late arrival time.  Updates as seen in HEP for strength progression. Pt will benefit from skilled PT to continue with core and pelvic strength and coordination.    PT Treatment/Interventions ADLs/Self Care Home Management;Biofeedback;Cryotherapy;Electrical Stimulation;Moist Heat;Therapeutic activities;Therapeutic exercise;Neuromuscular re-education;Patient/family education;Manual techniques;Taping;Dry needling;Passive range of motion    PT Home Exercise Plan Access Code: [email or web address]    Consulted and Agree with Plan of Care Patient           Patient will benefit from skilled therapeutic intervention in  order to improve the following deficits and impairments:  Pain,Postural dysfunction,Impaired flexibility,Decreased strength,Decreased coordination,Increased muscle spasms  Visit Diagnosis: Unspecified lack of coordination  Cramp and spasm     Problem List Patient Active Problem List   Diagnosis Date Noted  . IUD (intrauterine device) in place 09/27/2020  . Chronic pelvic pain in female 08/02/2020  . PCOS (polycystic ovarian syndrome) 04/20/2019    Junious Silk, PT 11/11/2020, 10:07 AM   [facility] [address] [place], [place], [address] Phone: [phone]   Fax:  [phone]  Name: Patricia Barber MRN: [id number] Date of Birth: 11-21-1990

## 2020-11-15 DIAGNOSIS — M609 Myositis, unspecified: Secondary | ICD-10-CM | POA: Diagnosis not present

## 2020-11-15 DIAGNOSIS — M953 Acquired deformity of neck: Secondary | ICD-10-CM | POA: Diagnosis not present

## 2020-11-15 DIAGNOSIS — M5388 Other specified dorsopathies, sacral and sacrococcygeal region: Secondary | ICD-10-CM | POA: Diagnosis not present

## 2020-11-15 DIAGNOSIS — M47812 Spondylosis without myelopathy or radiculopathy, cervical region: Secondary | ICD-10-CM | POA: Diagnosis not present

## 2020-11-18 ENCOUNTER — Encounter: Payer: Self-pay | Admitting: Physical Therapy

## 2020-11-18 ENCOUNTER — Other Ambulatory Visit: Payer: Self-pay

## 2020-11-18 ENCOUNTER — Ambulatory Visit: Payer: BC Managed Care – PPO | Admitting: Physical Therapy

## 2020-11-18 DIAGNOSIS — R252 Cramp and spasm: Secondary | ICD-10-CM

## 2020-11-18 DIAGNOSIS — R279 Unspecified lack of coordination: Secondary | ICD-10-CM | POA: Diagnosis not present

## 2020-11-18 NOTE — Patient Instructions (Signed)
Access Code: [email or web address] URL: [email or web address] Date: 11/18/2020 Prepared by: Dwana Curd  Exercises Supine Diaphragmatic Breathing - 3 x daily - 7 x weekly - 1 sets - 10 reps Supine Butterfly Groin Stretch - 1 x daily - 7 x weekly - 1 sets - 3 reps - 30 sec hold Supine Pelvic Floor Stretch - Hands on Knees - 1 x daily - 7 x weekly - 1 sets - 3 reps - 30 hold Supine Hamstring Stretch with Strap - 1 x daily - 7 x weekly - 3 reps - 1 sets - 30 sec hold Quadruped Exhale with Pelvic Floor Contraction and Arm Raise - 1 x daily - 7 x weekly - 3 sets - 10 reps Quadruped Pelvic Floor Contraction with Weight Shift Forward Backward - 1 x daily - 7 x weekly - 2 sets - 10 reps Supine Bridge with Pelvic Floor Contraction - 3 x daily - 7 x weekly - 1 sets - 10 reps - 3 sec hold Single Straight Leg Hip Hinge With Dowel - 1 x daily - 7 x weekly - 3 sets - 10 reps Sidelying Pelvic Floor Contraction with Hip Abduction - 1 x daily - 7 x weekly - 3 sets - 10 reps Sit to stand pelvic - blow as you go - 3 x daily - 7 x weekly - 10 reps - 1 sets

## 2020-11-18 NOTE — Therapy (Signed)
[facility] [address] [place], [place], [address] Phone: [phone]   Fax:  [phone]  Physical Therapy Treatment  Patient Details  Name: Patricia Barber MRN: [id number] Date of Birth: [birth year] Referring Provider (PT): Hurshel Party, CNM   Encounter Date: 11/18/2020   PT End of Session - 11/18/20 0809    Visit Number 4    Date for PT Re-Evaluation 01/16/21    Authorization Type BCBS    PT Start Time 0810   arrived late   PT Stop Time 0840    PT Time Calculation (min) 30 min    Activity Tolerance Patient tolerated treatment well    Behavior During Therapy [facility] for tasks assessed/performed           Past Medical History:  Diagnosis Date  . Anxiety   . Headache     Past Surgical History:  Procedure Laterality Date  . WISDOM TOOTH EXTRACTION      There were no vitals filed for this visit.   Subjective Assessment - 11/18/20 0821    Subjective I used the diva cup and it was a lot more comfortable than before.  I noticed it getting pushed down when I laughed and squatting.  Overall, low back has not been hurting and core feeling stronger    Patient Stated Goals not have pain with intercourse; use diva cup without pain    Currently in Pain? No/denies                             [facility] Adult PT Treatment/Exercise - 11/18/20 0001      Neuro Re-ed    Neuro Re-ed Details  cue to keep pelvis neutral during exercises      Lumbar Exercises: Standing   Forward Lunge 20 reps    Forward Lunge Limitations sliders    Side Lunge Limitations side step with green loop    Other Standing Lumbar Exercises half kneel chops green band - 15x each      Lumbar Exercises: Seated   Sit to Stand 10 reps   slow lowering with kegel     Lumbar Exercises: Supine   Heel Slides Limitations bent knee dropout on foam 15x    Bent Knee Raise 15 reps    Bent Knee Raise Limitations on foam    Dead Bug  Limitations UE only on foam 20x    Other Supine Lumbar Exercises horizontal ab on foam green      Lumbar Exercises: Sidelying   Hip Abduction Right;Left;15 reps                  PT Education - 11/18/20 0843    Education Details update in medbridge    Person(s) Educated Patient    Methods Explanation;Demonstration;Tactile cues;Verbal cues;Handout    Comprehension Verbalized understanding;Returned demonstration            PT Short Term Goals - 11/18/20 0810      PT SHORT TERM GOAL #1   Title ind with initial HEP    Status Achieved      PT SHORT TERM GOAL #2   Title Pt will report 25% less pain with intercourse    Status On-going             PT Long Term Goals - 11/18/20 7858      PT LONG TERM GOAL #1   Title Pt will report at least 75%  less frequency of pain with intercourse    Baseline still have not attempted    Status On-going      PT LONG TERM GOAL #2   Title Pt will report 2/10 pain at the most    Baseline recently 0/10 back pain    Status Achieved      PT LONG TERM GOAL #3   Title Pt will be in with advanced HEP for improved posture and management of low back pain    Status On-going      PT LONG TERM GOAL #4   Title Pt will be able to use diva cup without pain    Baseline much better but still feel it    Status On-going                 Plan - 11/18/20 0835    Clinical Impression Statement Pt is doing much better andmaking good progress with no back pain and able to have almost normal sensation with diva cup.  Pt is progressing exercise difficulty.  She still needs cues to make sure she is keeping the Rt LE steady with exercises.  pt will benefit from skilled PT to continue progression of strength with correct technique to ensure coordination of muscles to prevent risk of prolapse.    PT Treatment/Interventions ADLs/Self Care Home Management;Biofeedback;Cryotherapy;Electrical Stimulation;Moist Heat;Therapeutic activities;Therapeutic  exercise;Neuromuscular re-education;Patient/family education;Manual techniques;Taping;Dry needling;Passive range of motion    PT Next Visit Plan f/u with goals, core strength and add gluteal strength Rt side focus    PT Home Exercise Plan Access Code: [email or web address]    Consulted and Agree with Plan of Care Patient           Patient will benefit from skilled therapeutic intervention in order to improve the following deficits and impairments:  Pain,Postural dysfunction,Impaired flexibility,Decreased strength,Decreased coordination,Increased muscle spasms  Visit Diagnosis: Unspecified lack of coordination  Cramp and spasm     Problem List Patient Active Problem List   Diagnosis Date Noted  . IUD (intrauterine device) in place 09/27/2020  . Chronic pelvic pain in female 08/02/2020  . PCOS (polycystic ovarian syndrome) 04/20/2019    Junious Silk, PT 11/18/2020, 8:44 AM  [facility] [address] [place], [place], [address] Phone: [phone]   Fax:  [phone]  Name: Patricia Barber MRN: [id number] Date of Birth: [birth year]

## 2020-11-22 ENCOUNTER — Encounter: Payer: BC Managed Care – PPO | Admitting: Physical Therapy

## 2020-11-28 ENCOUNTER — Other Ambulatory Visit: Payer: Self-pay

## 2020-11-28 ENCOUNTER — Encounter: Payer: Self-pay | Admitting: Physical Therapy

## 2020-11-28 ENCOUNTER — Ambulatory Visit: Payer: BC Managed Care – PPO | Attending: Advanced Practice Midwife | Admitting: Physical Therapy

## 2020-11-28 DIAGNOSIS — R252 Cramp and spasm: Secondary | ICD-10-CM | POA: Insufficient documentation

## 2020-11-28 DIAGNOSIS — R279 Unspecified lack of coordination: Secondary | ICD-10-CM | POA: Diagnosis not present

## 2020-11-28 NOTE — Therapy (Signed)
[facility] [address] [place], [place], [address] Phone: [phone]   Fax:  [phone]  Physical Therapy Treatment  Patient Details  Name: Patricia Barber MRN: [id number] Date of Birth: January 27, 1991 Referring Provider (PT): Hurshel Party, CNM   Encounter Date: 11/28/2020   PT End of Session - 11/28/20 1503    Visit Number 5    Date for PT Re-Evaluation 01/16/21    Authorization Type BCBS    PT Start Time 1503   late   PT Stop Time 1530    PT Time Calculation (min) 27 min    Activity Tolerance Patient tolerated treatment well    Behavior During Therapy [facility] for tasks assessed/performed           Past Medical History:  Diagnosis Date  . Anxiety   . Headache     Past Surgical History:  Procedure Laterality Date  . WISDOM TOOTH EXTRACTION      There were no vitals filed for this visit.   Subjective Assessment - 11/28/20 1619    Subjective Feeling good, no new complaints    Patient Stated Goals not have pain with intercourse; use diva cup without pain    Currently in Pain? No/denies                             [facility] Adult PT Treatment/Exercise - 11/28/20 0001      Self-Care   Other Self-Care Comments  info on dilators      Neuro Re-ed    Neuro Re-ed Details  cue to keep pelvis neutral during exercises and LE/trunk aligned      Lumbar Exercises: Supine   Dead Bug Limitations LE up and tap down; LE up and UE overhead; one LE at a time with UE over and back      Lumbar Exercises: Prone   Straight Leg Raise 10 reps      Lumbar Exercises: Quadruped   Other Quadruped Lumbar Exercises primal push up 3 sec x 10    Other Quadruped Lumbar Exercises side plank on knees - 3 sec x 5 bilat                    PT Short Term Goals - 11/18/20 0810      PT SHORT TERM GOAL #1   Title ind with initial HEP    Status Achieved      PT SHORT TERM GOAL #2   Title Pt will report  25% less pain with intercourse    Status On-going             PT Long Term Goals - 11/18/20 5638      PT LONG TERM GOAL #1   Title Pt will report at least 75% less frequency of pain with intercourse    Baseline still have not attempted    Status On-going      PT LONG TERM GOAL #2   Title Pt will report 2/10 pain at the most    Baseline recently 0/10 back pain    Status Achieved      PT LONG TERM GOAL #3   Title Pt will be in with advanced HEP for improved posture and management of low back pain    Status On-going      PT LONG TERM GOAL #4   Title Pt will be able to use diva cup without pain  Baseline much better but still feel it    Status On-going                 Plan - 11/28/20 1534    Clinical Impression Statement Pt did well with exercise progression. She needed cues to exhale on exertions and to keep pelvis in line with upper and lower body during sidelying.  Pt needed cues to keep back flat in quadruped position.  She was given info on dilators in order to progress towards intercourse, patient has not yet attempted.  Overall, she is tolerating more challenging exercises with greater core control. Pt will benefit from skilled PT to return to all functional activities without discomfort.    PT Treatment/Interventions ADLs/Self Care Home Management;Biofeedback;Cryotherapy;Electrical Stimulation;Moist Heat;Therapeutic activities;Therapeutic exercise;Neuromuscular re-education;Patient/family education;Manual techniques;Taping;Dry needling;Passive range of motion    PT Next Visit Plan f/u with goals, new exercises, dilators if she ended up ordering    PT Home Exercise Plan Access Code: [email or web address]    Consulted and Agree with Plan of Care Patient           Patient will benefit from skilled therapeutic intervention in order to improve the following deficits and impairments:  Pain,Postural dysfunction,Impaired flexibility,Decreased strength,Decreased  coordination,Increased muscle spasms  Visit Diagnosis: Unspecified lack of coordination  Cramp and spasm     Problem List Patient Active Problem List   Diagnosis Date Noted  . IUD (intrauterine device) in place 09/27/2020  . Chronic pelvic pain in female 08/02/2020  . PCOS (polycystic ovarian syndrome) 04/20/2019    Junious Silk, PT 11/28/2020, 4:19 PM  [facility] [address] [place], [place], [address] Phone: [phone]   Fax:  [phone]  Name: Patricia Barber MRN: [id number] Date of Birth: Jan 05, 1991

## 2020-11-29 DIAGNOSIS — M609 Myositis, unspecified: Secondary | ICD-10-CM | POA: Diagnosis not present

## 2020-11-29 DIAGNOSIS — M5388 Other specified dorsopathies, sacral and sacrococcygeal region: Secondary | ICD-10-CM | POA: Diagnosis not present

## 2020-11-29 DIAGNOSIS — M47812 Spondylosis without myelopathy or radiculopathy, cervical region: Secondary | ICD-10-CM | POA: Diagnosis not present

## 2020-11-29 DIAGNOSIS — M953 Acquired deformity of neck: Secondary | ICD-10-CM | POA: Diagnosis not present

## 2020-12-07 DIAGNOSIS — M47812 Spondylosis without myelopathy or radiculopathy, cervical region: Secondary | ICD-10-CM | POA: Diagnosis not present

## 2020-12-07 DIAGNOSIS — M5388 Other specified dorsopathies, sacral and sacrococcygeal region: Secondary | ICD-10-CM | POA: Diagnosis not present

## 2020-12-07 DIAGNOSIS — M609 Myositis, unspecified: Secondary | ICD-10-CM | POA: Diagnosis not present

## 2020-12-07 DIAGNOSIS — M953 Acquired deformity of neck: Secondary | ICD-10-CM | POA: Diagnosis not present

## 2020-12-08 ENCOUNTER — Encounter: Payer: BC Managed Care – PPO | Admitting: Physical Therapy

## 2020-12-12 ENCOUNTER — Telehealth: Payer: Self-pay

## 2020-12-12 DIAGNOSIS — N921 Excessive and frequent menstruation with irregular cycle: Secondary | ICD-10-CM

## 2020-12-12 DIAGNOSIS — Z975 Presence of (intrauterine) contraceptive device: Secondary | ICD-10-CM

## 2020-12-12 MED ORDER — MEDROXYPROGESTERONE ACETATE 10 MG PO TABS: 10.0000 mg | ORAL_TABLET | Freq: Every day | ORAL | 2 refills | Status: DC

## 2020-12-12 NOTE — Telephone Encounter (Signed)
Rx for Provera 10mg  daily x 10 days with 2 refills sent per , CNM. Pt is aware of Rx being sent via MyChart message from [facility].

## 2020-12-13 DIAGNOSIS — M5388 Other specified dorsopathies, sacral and sacrococcygeal region: Secondary | ICD-10-CM | POA: Diagnosis not present

## 2020-12-13 DIAGNOSIS — M47812 Spondylosis without myelopathy or radiculopathy, cervical region: Secondary | ICD-10-CM | POA: Diagnosis not present

## 2020-12-13 DIAGNOSIS — M609 Myositis, unspecified: Secondary | ICD-10-CM | POA: Diagnosis not present

## 2020-12-13 DIAGNOSIS — M953 Acquired deformity of neck: Secondary | ICD-10-CM | POA: Diagnosis not present

## 2020-12-14 ENCOUNTER — Encounter: Payer: BC Managed Care – PPO | Admitting: Physical Therapy

## 2020-12-16 DIAGNOSIS — M79671 Pain in right foot: Secondary | ICD-10-CM | POA: Diagnosis not present

## 2020-12-16 DIAGNOSIS — B070 Plantar wart: Secondary | ICD-10-CM | POA: Diagnosis not present

## 2020-12-20 ENCOUNTER — Encounter: Payer: BC Managed Care – PPO | Admitting: Physical Therapy

## 2020-12-26 ENCOUNTER — Ambulatory Visit: Payer: BC Managed Care – PPO | Admitting: Physical Therapy

## 2020-12-27 ENCOUNTER — Other Ambulatory Visit: Payer: Self-pay

## 2020-12-27 ENCOUNTER — Encounter: Payer: Self-pay | Admitting: Physical Therapy

## 2020-12-27 ENCOUNTER — Ambulatory Visit: Payer: BC Managed Care – PPO | Attending: Advanced Practice Midwife | Admitting: Physical Therapy

## 2020-12-27 DIAGNOSIS — R252 Cramp and spasm: Secondary | ICD-10-CM | POA: Insufficient documentation

## 2020-12-27 DIAGNOSIS — R279 Unspecified lack of coordination: Secondary | ICD-10-CM | POA: Diagnosis not present

## 2020-12-27 NOTE — Therapy (Signed)
[facility] [address] [place], [place], [address] Phone: [phone]   Fax:  [phone]  Physical Therapy Treatment  Patient Details  Name: Patricia Barber MRN: [id number] Date of Birth: 1990/11/25 Referring Provider (PT): Elvera Maria, CNM   Encounter Date: 12/27/2020   PT End of Session - 12/27/20 0808    Visit Number 6    Date for PT Re-Evaluation 01/16/21    Authorization Type BCBS    PT Start Time 0806    PT Stop Time 0844    PT Time Calculation (min) 38 min    Activity Tolerance Patient tolerated treatment well    Behavior During Therapy [facility] for tasks assessed/performed           Past Medical History:  Diagnosis Date  . Anxiety   . Headache     Past Surgical History:  Procedure Laterality Date  . WISDOM TOOTH EXTRACTION      There were no vitals filed for this visit.   Subjective Assessment - 12/27/20 0810    Subjective Feeling like the back pain is much improved and the more I work on                             [facility] Adult PT Treatment/Exercise - 12/27/20 0001      Self-Care   Other Self-Care Comments  foam noodle massage on pelvic floor      Lumbar Exercises: Stretches   Figure 4 Stretch 3 reps;20 seconds      Lumbar Exercises: Supine   Dead Bug Limitations LE up and bil UE overhead 5x 5; LE only 3x30 sec holds    Bridge Limitations bridge on pball    Other Supine Lumbar Exercises roll ball side to side; foam roll DKTC and SKTC - 2 min each                  PT Education - 12/27/20 0905    Education Details info on dilators    Person(s) Educated Patient    Methods Explanation    Comprehension Verbalized understanding            PT Short Term Goals - 12/27/20 0831      PT SHORT TERM GOAL #2   Title Pt will report 25% less pain with intercourse    Status Achieved             PT Long Term Goals - 12/27/20 7858      PT LONG TERM GOAL  #1   Title Pt will report at least 75% less frequency of pain with intercourse    Baseline 90% better    Status Achieved      PT LONG TERM GOAL #2   Title Pt will report 2/10 pain at the most    Baseline 0/10 back pain    Status Achieved      PT LONG TERM GOAL #3   Title Pt will be in with advanced HEP for improved posture and management of low back pain    Status Achieved      PT LONG TERM GOAL #4   Title Pt will be able to use diva cup without pain    Baseline yes can use it    Status Achieved                 Plan - 12/27/20 0826    Clinical Impression Statement Pt  has been doing great with HEP.  She has met all goals and ind with HEP. Pt is recommended to d/c today.    PT Treatment/Interventions ADLs/Self Care Home Management;Biofeedback;Cryotherapy;Electrical Stimulation;Moist Heat;Therapeutic activities;Therapeutic exercise;Neuromuscular re-education;Patient/family education;Manual techniques;Taping;Dry needling;Passive range of motion    PT Next Visit Plan d/c    PT Home Exercise Plan Access Code: [email or web address]    Consulted and Agree with Plan of Care Patient           Patient will benefit from skilled therapeutic intervention in order to improve the following deficits and impairments:  Pain,Postural dysfunction,Impaired flexibility,Decreased strength,Decreased coordination,Increased muscle spasms  Visit Diagnosis: Unspecified lack of coordination  Cramp and spasm     Problem List Patient Active Problem List   Diagnosis Date Noted  . IUD (intrauterine device) in place 09/27/2020  . Chronic pelvic pain in female 08/02/2020  . PCOS (polycystic ovarian syndrome) 04/20/2019    Jule Ser, PT 12/27/2020, 9:06 AM  [facility] [address] [place], [place], [address] Phone: [phone]   Fax:  [phone]  Name: Patricia Barber MRN: [id number] Date of Birth: Sep 22, 1990  PHYSICAL THERAPY  DISCHARGE SUMMARY  Visits from Start of Care: 6  Current functional level related to goals / functional outcomes: See above goals met   Remaining deficits: See above   Education / Equipment: HEP  Plan: Patient agrees to discharge.  Patient goals were met. Patient is being discharged due to meeting the stated rehab goals.  ?????    [employer], PT 12/27/20 9:23 AM

## 2020-12-28 DIAGNOSIS — M47812 Spondylosis without myelopathy or radiculopathy, cervical region: Secondary | ICD-10-CM | POA: Diagnosis not present

## 2020-12-28 DIAGNOSIS — M609 Myositis, unspecified: Secondary | ICD-10-CM | POA: Diagnosis not present

## 2020-12-28 DIAGNOSIS — M5388 Other specified dorsopathies, sacral and sacrococcygeal region: Secondary | ICD-10-CM | POA: Diagnosis not present

## 2020-12-28 DIAGNOSIS — M953 Acquired deformity of neck: Secondary | ICD-10-CM | POA: Diagnosis not present

## 2021-01-23 DIAGNOSIS — F321 Major depressive disorder, single episode, moderate: Secondary | ICD-10-CM | POA: Diagnosis not present

## 2021-01-30 DIAGNOSIS — M47812 Spondylosis without myelopathy or radiculopathy, cervical region: Secondary | ICD-10-CM | POA: Diagnosis not present

## 2021-01-30 DIAGNOSIS — M609 Myositis, unspecified: Secondary | ICD-10-CM | POA: Diagnosis not present

## 2021-01-30 DIAGNOSIS — M5388 Other specified dorsopathies, sacral and sacrococcygeal region: Secondary | ICD-10-CM | POA: Diagnosis not present

## 2021-01-30 DIAGNOSIS — M953 Acquired deformity of neck: Secondary | ICD-10-CM | POA: Diagnosis not present

## 2021-02-06 DIAGNOSIS — M609 Myositis, unspecified: Secondary | ICD-10-CM | POA: Diagnosis not present

## 2021-02-06 DIAGNOSIS — M953 Acquired deformity of neck: Secondary | ICD-10-CM | POA: Diagnosis not present

## 2021-02-06 DIAGNOSIS — M47812 Spondylosis without myelopathy or radiculopathy, cervical region: Secondary | ICD-10-CM | POA: Diagnosis not present

## 2021-02-06 DIAGNOSIS — M5388 Other specified dorsopathies, sacral and sacrococcygeal region: Secondary | ICD-10-CM | POA: Diagnosis not present

## 2021-02-14 DIAGNOSIS — M953 Acquired deformity of neck: Secondary | ICD-10-CM | POA: Diagnosis not present

## 2021-02-14 DIAGNOSIS — M47812 Spondylosis without myelopathy or radiculopathy, cervical region: Secondary | ICD-10-CM | POA: Diagnosis not present

## 2021-02-14 DIAGNOSIS — M5388 Other specified dorsopathies, sacral and sacrococcygeal region: Secondary | ICD-10-CM | POA: Diagnosis not present

## 2021-02-14 DIAGNOSIS — M609 Myositis, unspecified: Secondary | ICD-10-CM | POA: Diagnosis not present

## 2021-02-20 DIAGNOSIS — M79672 Pain in left foot: Secondary | ICD-10-CM | POA: Diagnosis not present

## 2021-02-20 DIAGNOSIS — F331 Major depressive disorder, recurrent, moderate: Secondary | ICD-10-CM | POA: Diagnosis not present

## 2021-02-20 DIAGNOSIS — B070 Plantar wart: Secondary | ICD-10-CM | POA: Diagnosis not present

## 2021-02-20 DIAGNOSIS — M79671 Pain in right foot: Secondary | ICD-10-CM | POA: Diagnosis not present

## 2021-03-10 DIAGNOSIS — Z1159 Encounter for screening for other viral diseases: Secondary | ICD-10-CM | POA: Diagnosis not present

## 2021-03-10 DIAGNOSIS — Z111 Encounter for screening for respiratory tuberculosis: Secondary | ICD-10-CM | POA: Diagnosis not present

## 2021-03-10 DIAGNOSIS — Z029 Encounter for administrative examinations, unspecified: Secondary | ICD-10-CM | POA: Diagnosis not present

## 2021-03-10 DIAGNOSIS — Z020 Encounter for examination for admission to educational institution: Secondary | ICD-10-CM | POA: Diagnosis not present

## 2021-03-10 DIAGNOSIS — F325 Major depressive disorder, single episode, in full remission: Secondary | ICD-10-CM | POA: Diagnosis not present

## 2021-03-10 DIAGNOSIS — E559 Vitamin D deficiency, unspecified: Secondary | ICD-10-CM | POA: Diagnosis not present

## 2021-03-13 DIAGNOSIS — M953 Acquired deformity of neck: Secondary | ICD-10-CM | POA: Diagnosis not present

## 2021-03-13 DIAGNOSIS — M609 Myositis, unspecified: Secondary | ICD-10-CM | POA: Diagnosis not present

## 2021-03-13 DIAGNOSIS — M47812 Spondylosis without myelopathy or radiculopathy, cervical region: Secondary | ICD-10-CM | POA: Diagnosis not present

## 2021-03-13 DIAGNOSIS — M5388 Other specified dorsopathies, sacral and sacrococcygeal region: Secondary | ICD-10-CM | POA: Diagnosis not present

## 2021-04-03 DIAGNOSIS — F321 Major depressive disorder, single episode, moderate: Secondary | ICD-10-CM | POA: Diagnosis not present

## 2021-04-06 DIAGNOSIS — Z23 Encounter for immunization: Secondary | ICD-10-CM | POA: Diagnosis not present

## 2021-05-17 IMAGING — US OBSTETRIC <14 WK US AND TRANSVAGINAL OB US
1 series · 15 of 28 positions shown · non-contrast
Comparison: None.

CLINICAL DATA: Vaginal bleeding

EXAM:
OBSTETRIC <14 WK US AND TRANSVAGINAL OB US
TECHNIQUE: Both transabdominal and transvaginal ultrasound examinations were
performed for complete evaluation of the gestation as well as the
maternal uterus, adnexal regions, and pelvic cul-de-sac.
Transvaginal technique was performed to assess early pregnancy.

[Series 1: obstetric <14 wk us and transvaginal ob us · 15 of 69 slices shown]
[im 1/69]
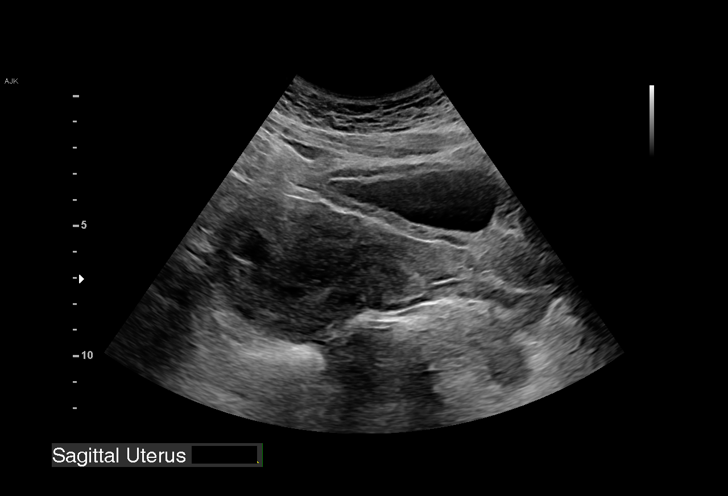
[im 6/69]
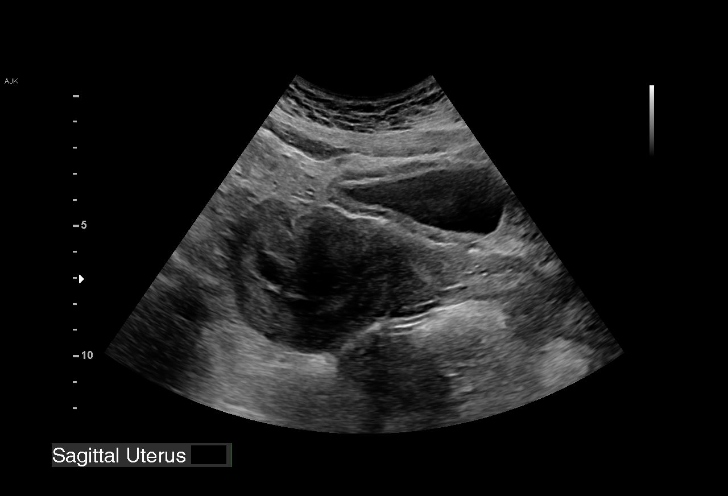
[im 11/69]
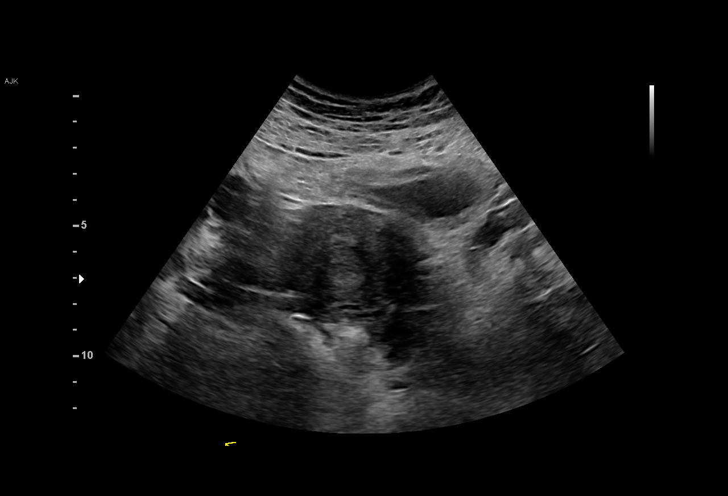
[im 16/69]
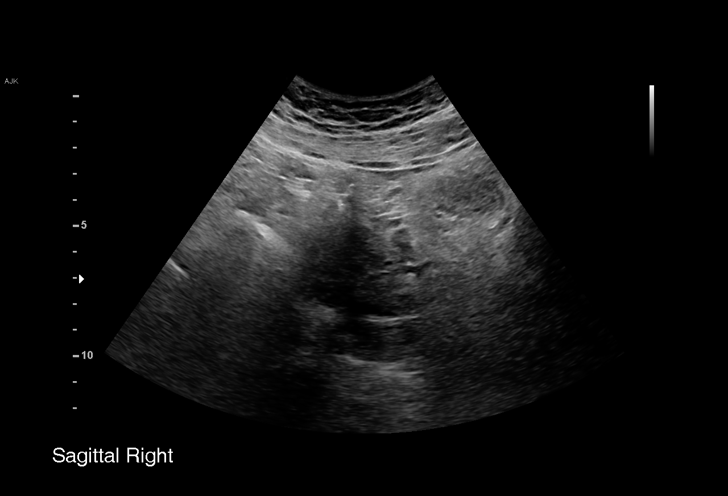
[im 21/69]
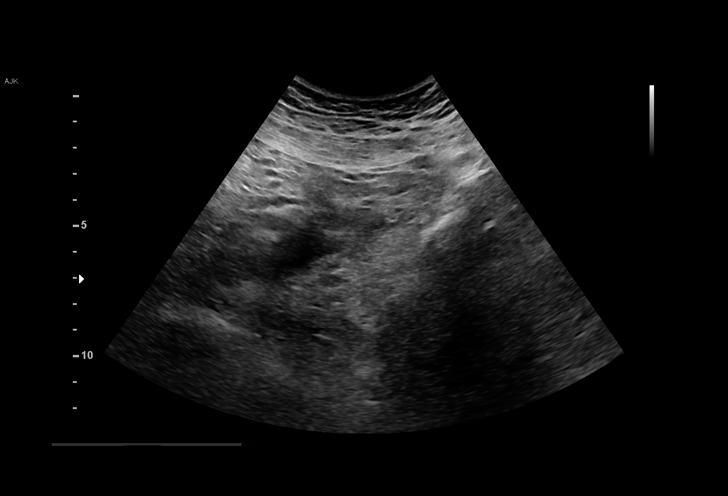
[im 26/69]
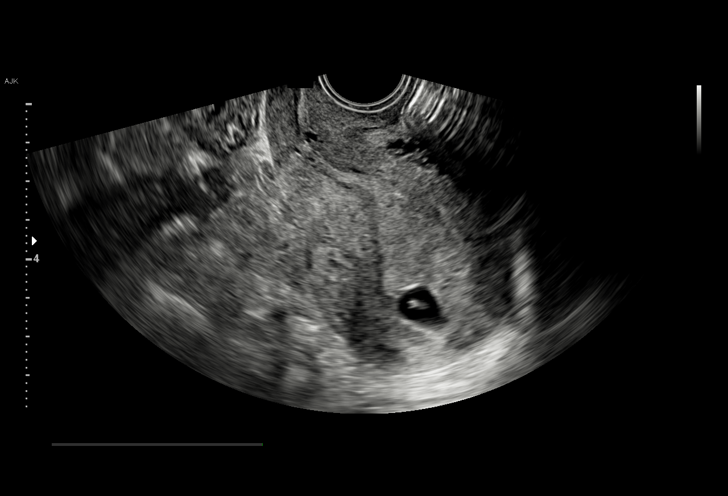
[im 31/69]
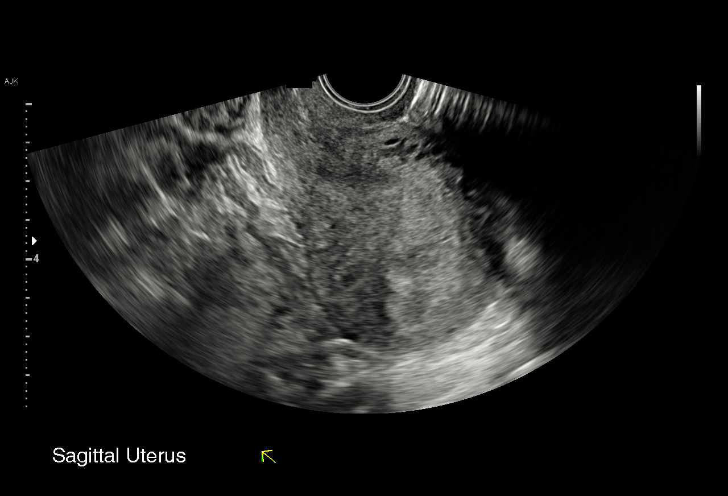
[im 36/69]
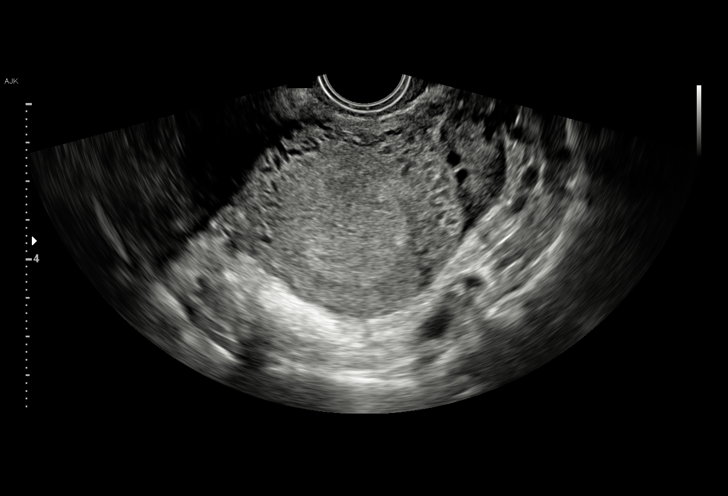
[im 38/69]
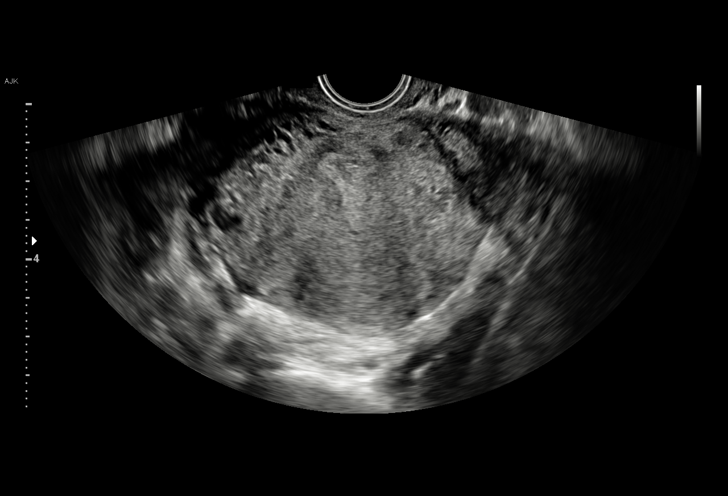
[im 43/69]
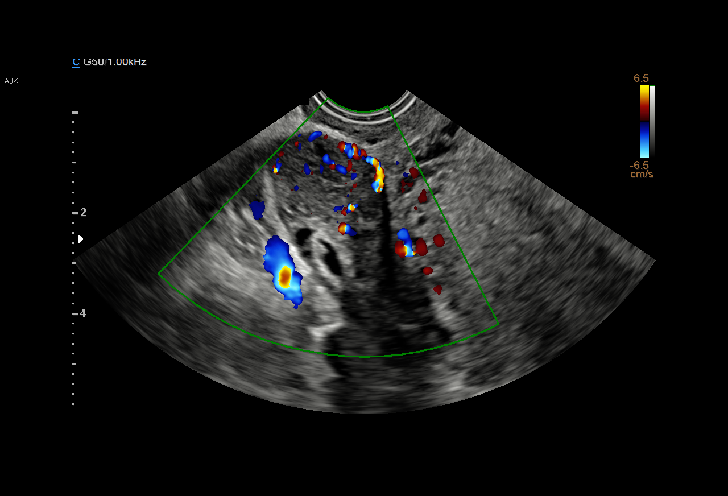
[im 48/69]
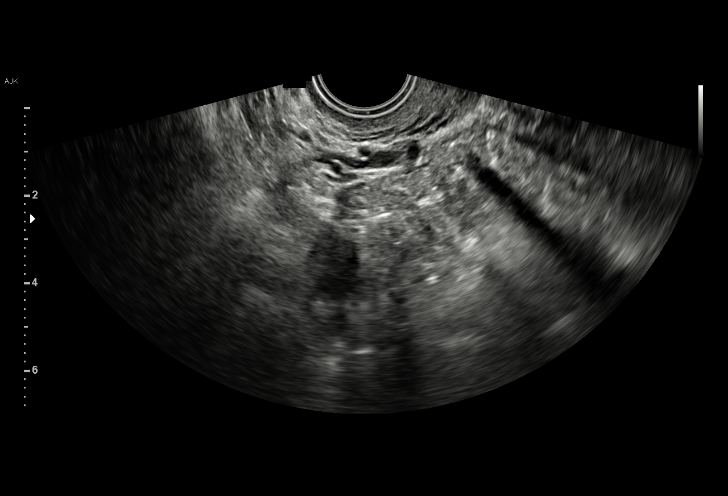
[im 53/69]
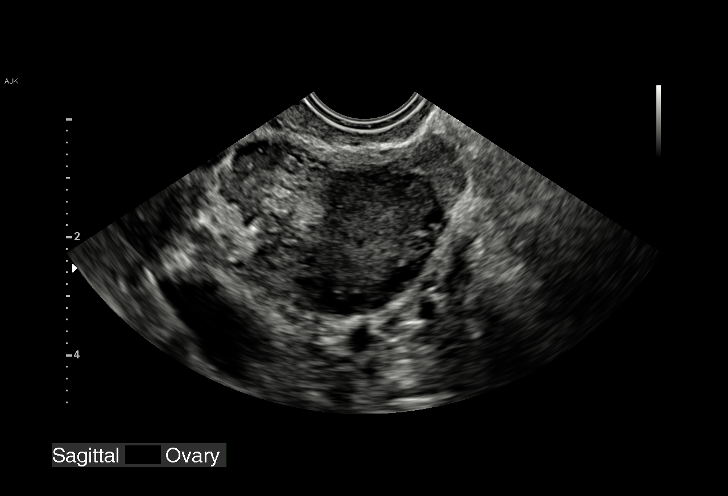
[im 58/69]
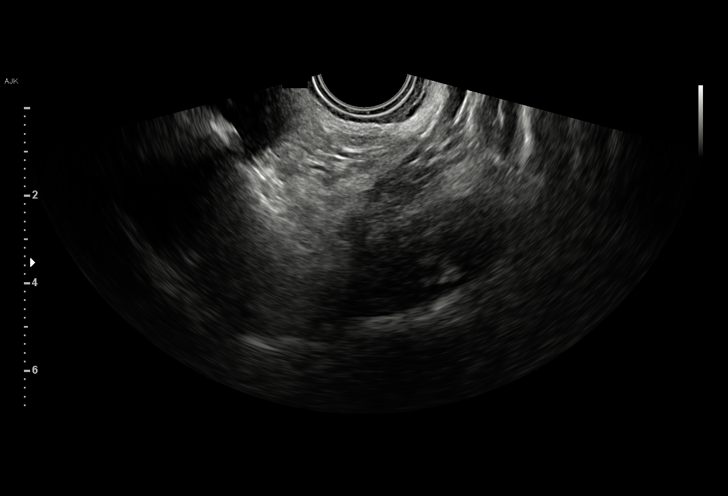
[im 63/69]
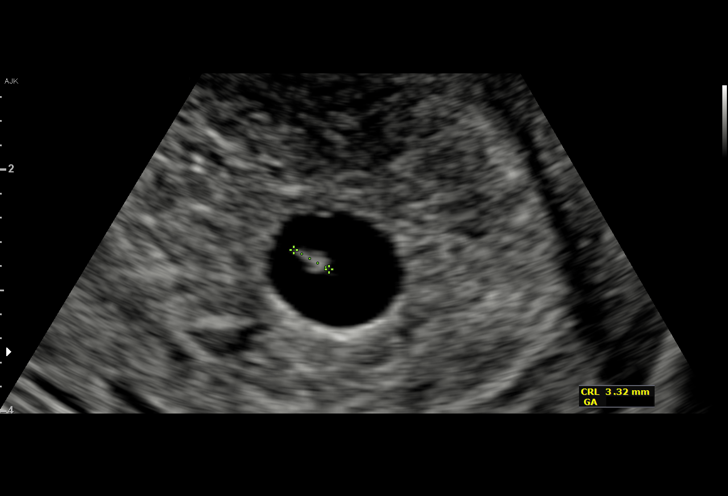
[im 69/69]
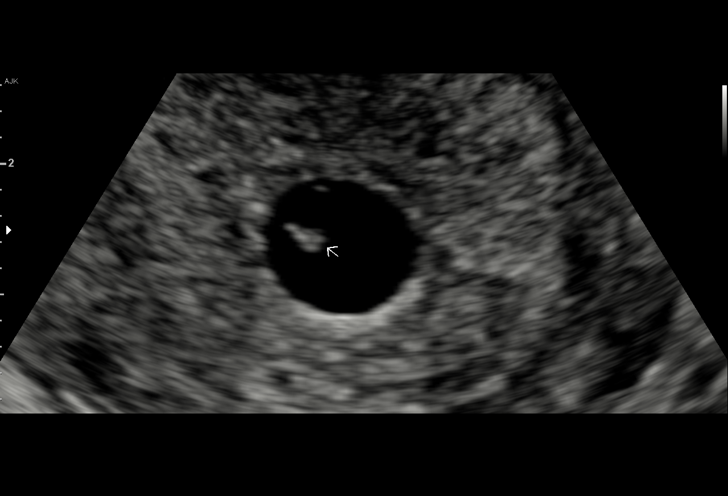

[15 of 28 positions shown; findings below may reference images not displayed]

FINDINGS: Intrauterine gestational sac: Single

Yolk sac:  Visualized.

Embryo:  Visualized.

Cardiac Activity: Not Visualized.

CRL: 3.4 mm   5 w   6 d                  US EDC: 12/05/2019

Subchorionic hemorrhage:  None visualized.

Maternal uterus/adnexae: Bilateral ovaries are within normal limits.
The left ovary measures 2.9 x 2 by 1.7 cm. The right ovary measures
3.2 x 3 by 2.5 cm. No significant free fluid.
IMPRESSION: Single intrauterine pregnancy with visible embryo, average
crown-rump length of 3.4 mm but no cardiac activity identified.
Findings are suspicious but not yet definitive for failed pregnancy.
Recommend follow-up US in 10-14 days for definitive diagnosis. This
recommendation follows SRU consensus guidelines: Diagnostic Criteria
for Nonviable Pregnancy Early in the First Trimester. N Engl J Med

## 2021-06-06 DIAGNOSIS — Z23 Encounter for immunization: Secondary | ICD-10-CM | POA: Diagnosis not present

## 2021-06-13 DIAGNOSIS — M25561 Pain in right knee: Secondary | ICD-10-CM | POA: Diagnosis not present

## 2021-06-13 DIAGNOSIS — G8929 Other chronic pain: Secondary | ICD-10-CM | POA: Diagnosis not present

## 2021-07-22 ENCOUNTER — Encounter: Payer: Self-pay | Admitting: Advanced Practice Midwife

## 2021-08-08 ENCOUNTER — Ambulatory Visit: Payer: BC Managed Care – PPO | Admitting: Advanced Practice Midwife

## 2021-08-08 ENCOUNTER — Other Ambulatory Visit: Payer: Self-pay

## 2021-08-08 ENCOUNTER — Encounter: Payer: Self-pay | Admitting: Advanced Practice Midwife

## 2021-08-08 VITALS — BP 108/76 | HR 77 | Resp 16 | Ht 64.0 in | Wt 213.0 lb

## 2021-08-08 DIAGNOSIS — Z30432 Encounter for removal of intrauterine contraceptive device: Secondary | ICD-10-CM | POA: Diagnosis not present

## 2021-08-08 DIAGNOSIS — Z23 Encounter for immunization: Secondary | ICD-10-CM | POA: Diagnosis not present

## 2021-08-08 NOTE — Progress Notes (Signed)
    GYNECOLOGY CLINIC PROCEDURE NOTE  Ms. Patricia Barber is a 30 y.o. [id number] here for Mirena IUD removal. No GYN concerns.  Last pap smear was on 04/20/2019 and was normal.  IUD Removal  Patient was in the dorsal lithotomy position, normal external genitalia was noted.  A speculum was placed in the patient's vagina, normal discharge was noted, no lesions. The multiparous cervix was visualized, no lesions, no abnormal discharge.  The strings of the IUD were grasped and pulled using ring forceps. The IUD was removed in its entirety.  Patient tolerated the procedure well.    Patient will use condoms/withdrawal for contraception. Contraceptive counseling done today and pt considering Caya diaphram or Phexxi.  Pt husband may get a vasectomy. Routine preventative health maintenance measures emphasized.  Hurshel Party, CNM 08/08/2021 8:19 AM

## 2021-08-15 DIAGNOSIS — F331 Major depressive disorder, recurrent, moderate: Secondary | ICD-10-CM | POA: Diagnosis not present

## 2022-03-19 IMAGING — US US MFM OB DETAIL+14 WK
1 series · 13 of 28 positions shown · non-contrast
Comparison: none

[Series 1: us mfm ob detail+14 wk · 98 acquisitions, 13 frames shown]
[im 4/98]
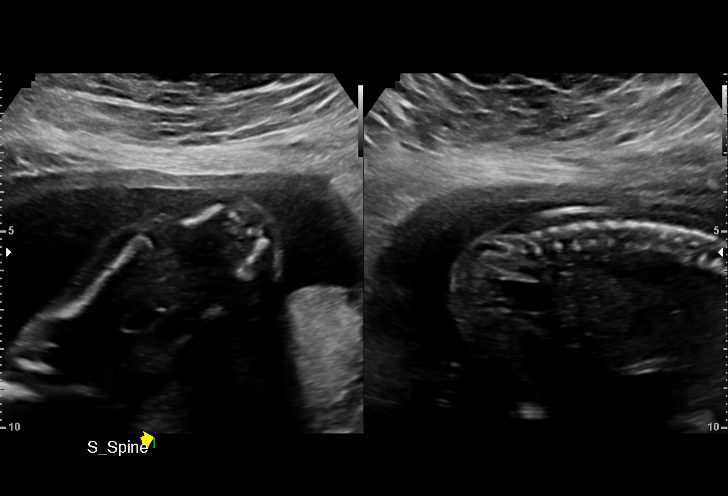
[im 11/98]
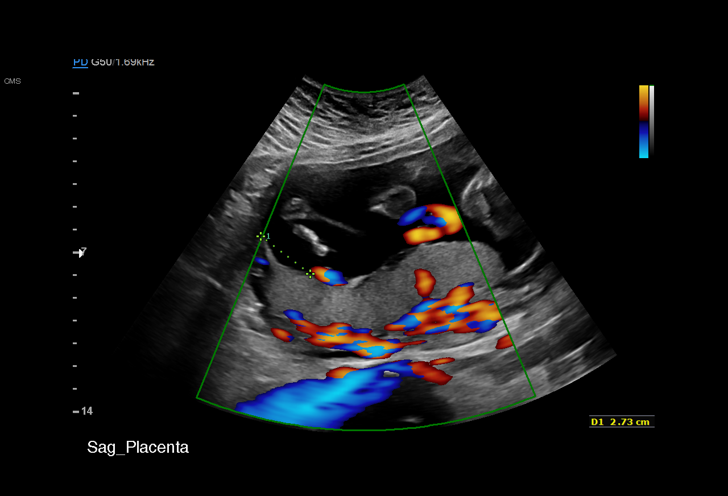
[im 18/98]
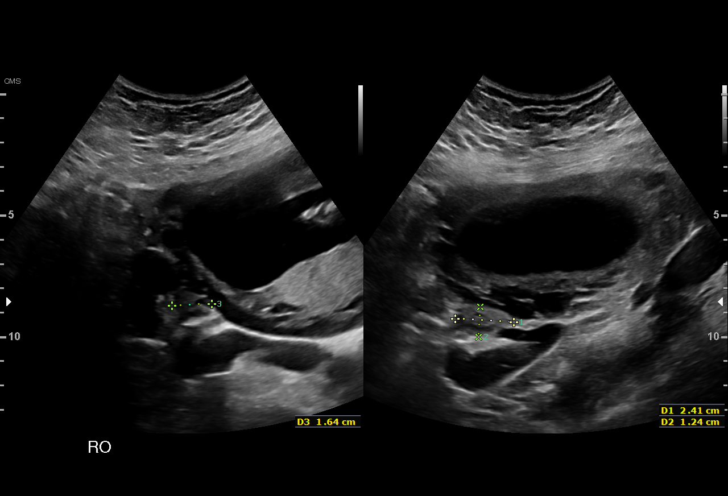
[im 26/98]
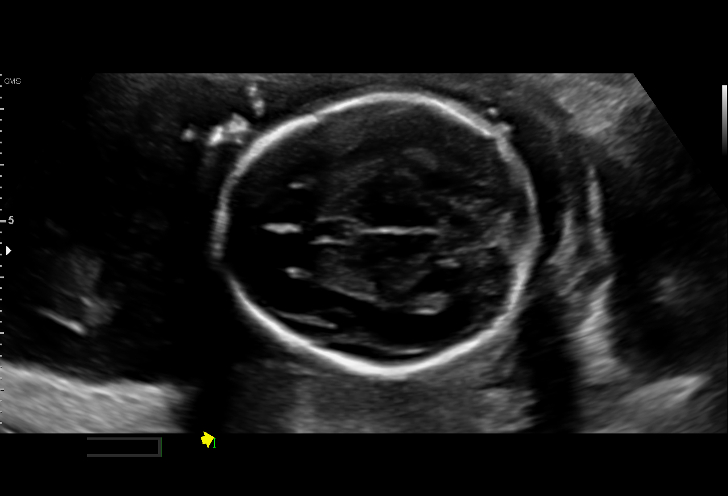
[im 33/98]
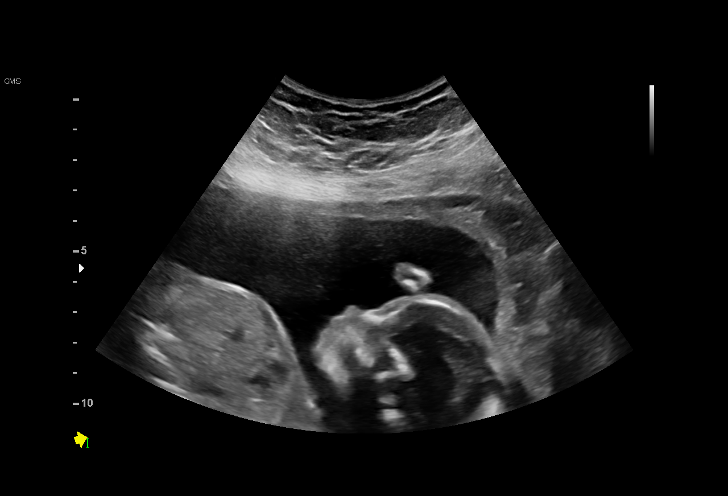
[im 40/98]
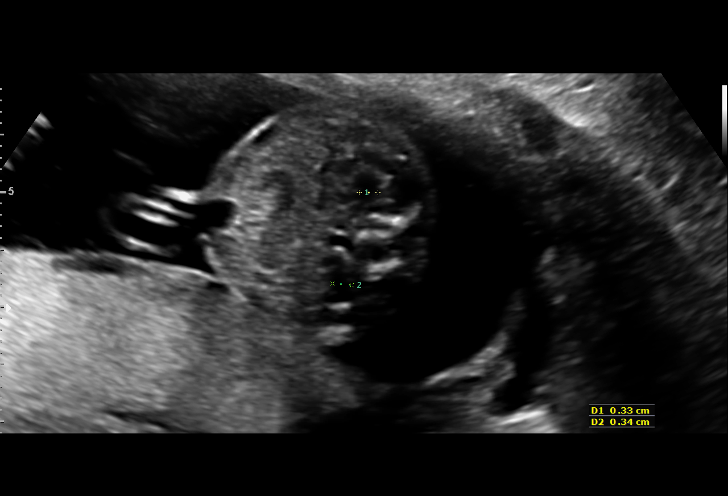
[im 51/98]
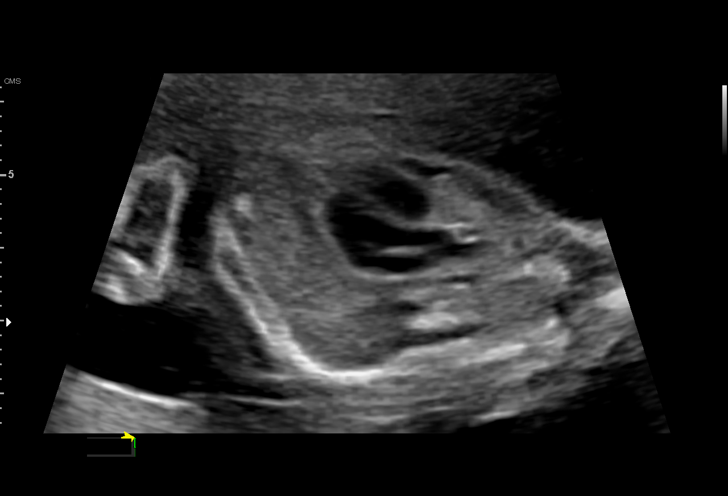
[im 58/98]
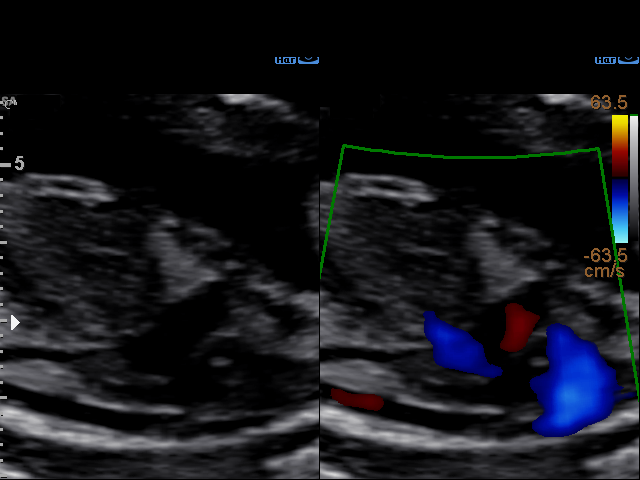
[im 65/98]
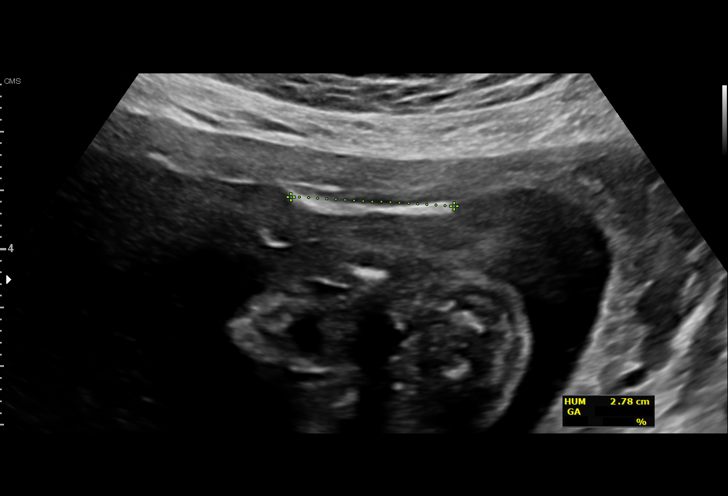
[im 72/98]
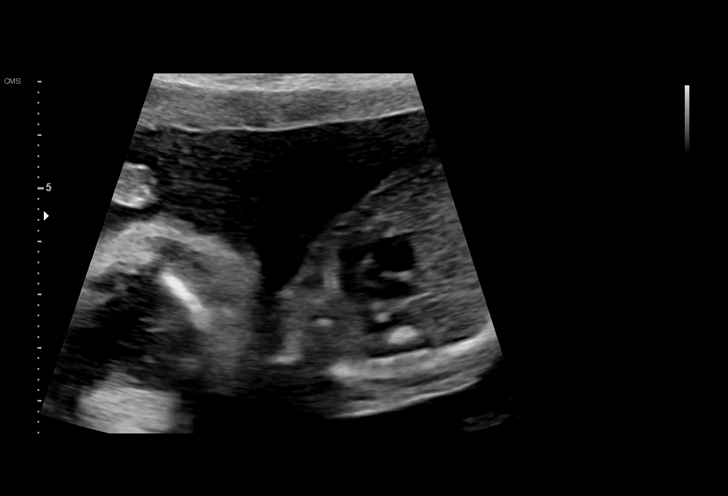
[im 80/98]
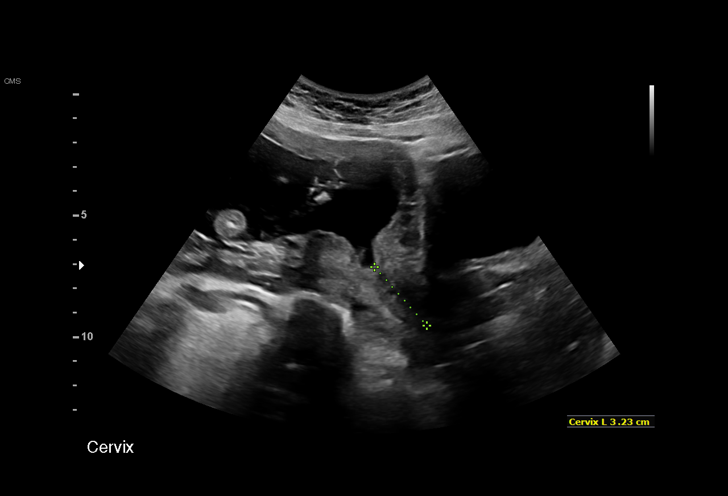
[im 87/98]
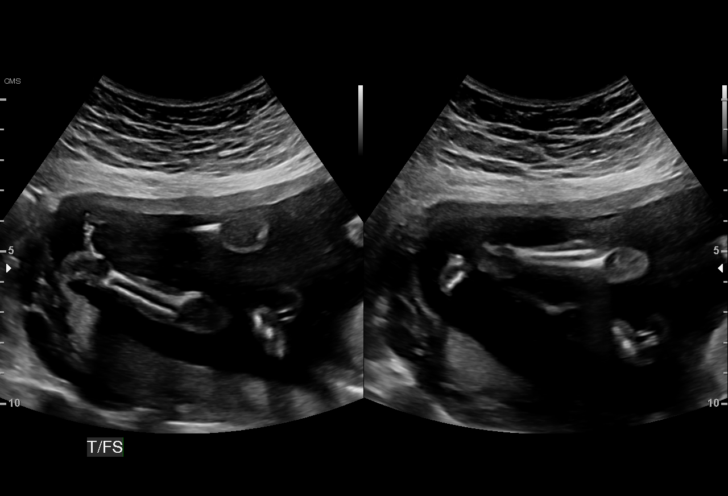
[im 94/98]
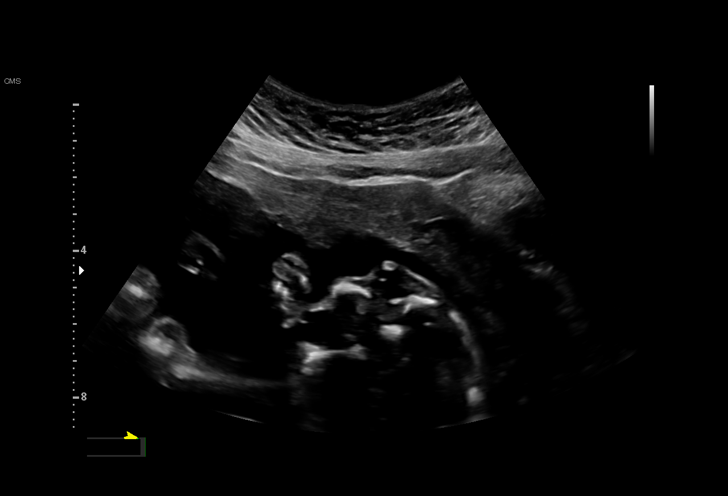

[13 of 28 positions shown; findings below may reference images not displayed]

ALKING

Indications

 Antenatal screening for malformations - low
 risk NIPS
 Obesity complicating pregnancy, second
 trimester (pregravid BMI 31)
 19 weeks gestation of pregnancy
Fetal Evaluation

 Num Of Fetuses:          1
 Fetal Heart              150
 Rate(bpm):
 Cardiac Activity:        Observed
 Presentation:            Cephalic
 Placenta:                Posterior
 P. Cord Insertion:       Visualized

 Amniotic Fluid
 AFI FV:      Within normal limits

                             Largest Pocket(cm)

Biometry

 BPD:      48.3  mm     G. Age:  20w 4d         79  %    CI:          80.7  %    70 - 86
                                                         FL/HC:       18.3  %    16.8 -
 HC:      169.8  mm     G. Age:  19w 4d         30  %    HC/AC:       1.19       1.09 -
 AC:      142.5  mm     G. Age:  19w 4d         36  %    FL/BPD:      64.2  %
 FL:         31  mm     G. Age:  19w 4d         33  %    FL/AC:       21.8  %    20 - 24
 HUM:      28.6  mm     G. Age:  19w 2d         37  %
 CER:      19.7  mm     G. Age:  19w 1d         43  %
 NFT:       5.1  mm
 CM:        4.1  mm
 Est. FW:     304   g    0 lb 11 oz      33  %
                    m
OB History

 Gravidity:    3         Term:   1        Prem:   0         SAB:   1
 TOP:          0       Ectopic:  0        Living: 1
Gestational Age

 LMP:           21w 0d        Date:  09/16/19                 EDD:    06/22/20
 U/S Today:     19w 6d                                        EDD:    06/30/20
 Best:          19w 6d     Det. By:  Early Ultrasound         EDD:    06/30/20
                                     (11/26/19)
Anatomy

 Cranium:               Appears normal         Aortic Arch:            Appears normal
 Cavum:                 Appears normal         Ductal Arch:            Appears normal
 Ventricles:            Appears normal         Diaphragm:              Appears normal
 Choroid Plexus:        Appears normal         Stomach:                Appears normal,
                                                                       left sided
 Cerebellum:            Appears normal         Abdomen:                Appears normal
 Posterior Fossa:       Appears normal         Abdominal Wall:         Appears nml (cord
                                                                       insert, abd wall)
 Nuchal Fold:           Appears normal         Cord Vessels:           Appears normal (3
                                                                       vessel cord)
 Face:                  Appears normal         Kidneys:                Appear normal
                        (orbits and profile)
 Lips:                  Appears normal         Bladder:                Appears normal
 Thoracic:              Appears normal         Spine:                  Appears normal
 Heart:                 Appears normal         Upper Extremities:      Appears normal
                        (4CH, axis, and
                        situs)
 RVOT:                  Appears normal         Lower Extremities:      Appears normal
 LVOT:                  Appears normal

 Other:  Heels/feet and open hands/5th digits visualized. Nasal bone
         visualized.
Cervix Uterus Adnexa

 Cervix
 Length:           3.41  cm.
 Normal appearance by transabdominal scan.

 Uterus
 No abnormality visualized.

 Right Ovary
 Within normal limits.

 Left Ovary
 Within normal limits.

 Cul De Sac
 No free fluid seen.
 Adnexa
 No abnormality visualized.
Impression

 Single intrauterine pregnancy with measurements consistent
 with dates
 Normal anatomy with good fetal movement and amniotic
 fluid observed.
Recommendations

 Follow up growth as clinically indicated.

## 2022-04-03 ENCOUNTER — Encounter: Payer: Self-pay | Admitting: Obstetrics and Gynecology

## 2022-04-03 DIAGNOSIS — Z3480 Encounter for supervision of other normal pregnancy, unspecified trimester: Secondary | ICD-10-CM | POA: Insufficient documentation

## 2022-04-03 NOTE — Progress Notes (Unsigned)
Last pap 8/20.Bedside U/S shows single IUP with FHT of 171 and CRL is 23.77mm  [id number]

## 2022-04-05 ENCOUNTER — Ambulatory Visit (INDEPENDENT_AMBULATORY_CARE_PROVIDER_SITE_OTHER): Payer: 59

## 2022-04-05 ENCOUNTER — Encounter: Payer: Self-pay | Admitting: Obstetrics and Gynecology

## 2022-04-05 ENCOUNTER — Other Ambulatory Visit (HOSPITAL_COMMUNITY)
Admission: RE | Admit: 2022-04-05 | Discharge: 2022-04-05 | Disposition: A | Discharge status: Home / Self Care | Payer: 59 | Source: Ambulatory Visit | Attending: Obstetrics and Gynecology | Admitting: Obstetrics and Gynecology

## 2022-04-05 ENCOUNTER — Ambulatory Visit (INDEPENDENT_AMBULATORY_CARE_PROVIDER_SITE_OTHER): Payer: 59 | Admitting: Obstetrics and Gynecology

## 2022-04-05 VITALS — BP 127/88 | HR 71 | Wt 205.0 lb

## 2022-04-05 DIAGNOSIS — Z3480 Encounter for supervision of other normal pregnancy, unspecified trimester: Secondary | ICD-10-CM

## 2022-04-05 DIAGNOSIS — R8271 Bacteriuria: Secondary | ICD-10-CM

## 2022-04-05 DIAGNOSIS — Z3A09 9 weeks gestation of pregnancy: Secondary | ICD-10-CM

## 2022-04-05 NOTE — Progress Notes (Signed)
History:   Patricia Barber is a 31 y.o. [id number] at [redacted]w[redacted]d by early ultrasound being seen today for her first obstetrical visit.   Patient does intend to breast feed.   Pregnancy history fully reviewed. Obstetrical history is significant for 2 FT SVD  Patient reports no complaints.      HISTORY: OB History  Gravida Para Term Preterm AB Living  4 2 2  0 1 2  SAB IAB Ectopic Multiple Live Births  1 0 0 0 2    # Outcome Date GA Lbr Len/2nd Weight Sex Delivery Anes PTL Lv  4 Current           3 Term 07/02/20 [redacted]w[redacted]d  6 lb 7.9 oz (2.946 kg) M Vag-Spont Local  LIV     Name: DYAN, LABARBERA     Apgar1: 9  Apgar5: 9  2 Term 06/17/16 [redacted]w[redacted]d 18:05 / 00:08 6 lb 8.1 oz (2.951 kg) F Vag-Spont Local  LIV     Name: WYNNE, ROZAK     Apgar1: 8  Apgar5: 9  1 SAB              Last pap smear was done 02/2022 and was normal Lab Results  Component Value Date   DIAGPAP  04/20/2019    NEGATIVE FOR INTRAEPITHELIAL LESIONS OR MALIGNANCY.   DIAGPAP  04/29/2018    NEGATIVE FOR INTRAEPITHELIAL LESIONS OR MALIGNANCY.     Past Medical History:  Diagnosis Date   Anxiety    Headache    Past Surgical History:  Procedure Laterality Date   WISDOM TOOTH EXTRACTION     Family History  Problem Relation Age of Onset   Asthma Mother    Hyperlipidemia Father    Hypertension Father    Migraines Brother    Stroke Paternal Grandmother    Heart disease Paternal Grandfather    Hyperlipidemia Paternal Grandfather    Thyroid disease Neg Hx    Alzheimer's disease Neg Hx    Social History   Tobacco Use   Smoking status: Never   Smokeless tobacco: Never  Vaping Use   Vaping Use: Never used  Substance Use Topics   Alcohol use: No   Drug use: No   Allergies  Allergen Reactions   Lactose Diarrhea and Nausea And Vomiting   No current outpatient medications on file prior to visit.   No current facility-administered medications on file prior to visit.    Review of Systems Pertinent items noted in  HPI and remainder of comprehensive ROS otherwise negative.  Physical Exam:   Vitals:   04/05/22 0949  BP: 127/88  Pulse: 71  Weight: 205 lb (93 kg)   Fetal Heart Rate (bpm): 171 Bedside Ultrasound for FHR check: Viable intrauterine pregnancy with positive cardiac activity noted, fetal heart rate 171bpm  Patient informed that the ultrasound is considered a limited obstetric ultrasound and is not intended to be a complete ultrasound exam.  Patient also informed that the ultrasound is not being completed with the intent of assessing for fetal or placental anomalies or any pelvic abnormalities.  Explained that the purpose of today's ultrasound is to assess for fetal heart rate.  Patient acknowledges the purpose of the exam and the limitations of the study.  General: well-developed, well-nourished female in no acute distress  Breasts:  normal appearance, no masses or tenderness bilaterally  Skin: normal coloration and turgor, no rashes  Neurologic: oriented, normal, negative, normal mood  Extremities: normal strength, tone, and muscle mass, ROM of all  joints is normal  HEENT PERRLA, extraocular movement intact and sclera clear, anicteric  Neck supple and no masses  Cardiovascular: regular rate and rhythm  Respiratory:  no respiratory distress, normal breath sounds  Abdomen: soft, non-tender; bowel sounds normal; no masses,  no organomegaly  Pelvic: Deferred    Assessment:    Pregnancy: [id number] Patient Active Problem List   Diagnosis Date Noted   Supervision of other normal pregnancy, antepartum 04/03/2022   Chronic pelvic pain in female 08/02/2020   PCOS (polycystic ovarian syndrome) 04/20/2019     Plan:    1. Supervision of other normal pregnancy, antepartum - US OB Limited; Future - Culture, OB Urine - GC/Chlamydia probe amp ([place])not at [facility] - Hepatitis C antibody - Obstetric panel - HIV Antibody (routine testing w rflx) - Babyscripts Schedule  Optimization   Initial labs drawn. Continue prenatal vitamins. Problem list reviewed and updated. Genetic Screening discussed, NIPS: ordered. Ultrasound discussed; fetal anatomic survey:  order at next appt . Anticipatory guidance about prenatal visits given including labs, ultrasounds, and testing. Discussed usage of Babyscripts and virtual visits  The nature of [place] [facility] Practice with multiple MDs and Advanced Practice Providers was explained to patient; also emphasized that residents, students are part of our team. Routine obstetric precautions reviewed. Encouraged to seek out care at office or emergency room [facility] preferred) for urgent and/or emergent concerns. Return in about 4 weeks (around 05/03/2022) for OB VISIT, MD or APP.    Milas Hock, MD, FACOG Obstetrician & Gynecologist, [facility], [facility]

## 2022-04-06 LAB — GC/CHLAMYDIA PROBE AMP (~~LOC~~) NOT AT ARMC
Chlamydia: NEGATIVE
Comment: NEGATIVE
Comment: NORMAL
Neisseria Gonorrhea: NEGATIVE

## 2022-04-07 LAB — OBSTETRIC PANEL
Absolute Monocytes: 207 cells/uL (ref 200–950)
Antibody Screen: NOT DETECTED
Basophils Absolute: 21 cells/uL (ref 0–200)
Basophils Relative: 0.3 %
Eosinophils Absolute: 28 cells/uL (ref 15–500)
Eosinophils Relative: 0.4 %
HCT: 38.3 % (ref 35.0–45.0)
Hemoglobin: 12.2 g/dL (ref 11.7–15.5)
Hepatitis B Surface Ag: NONREACTIVE
Lymphs Abs: 2387 cells/uL (ref 850–3900)
MCH: 30.5 pg (ref 27.0–33.0)
MCHC: 31.9 g/dL — ABNORMAL LOW (ref 32.0–36.0)
MCV: 95.8 fL (ref 80.0–100.0)
MPV: 11.2 fL (ref 7.5–12.5)
Monocytes Relative: 3 %
Neutro Abs: 4257 cells/uL (ref 1500–7800)
Neutrophils Relative %: 61.7 %
Platelets: 283 10*3/uL (ref 140–400)
RBC: 4 10*6/uL (ref 3.80–5.10)
RDW: 12.9 % (ref 11.0–15.0)
RPR Ser Ql: NONREACTIVE
Rubella: 1.73 Index
Total Lymphocyte: 34.6 %
WBC: 6.9 10*3/uL (ref 3.8–10.8)

## 2022-04-07 LAB — HIV ANTIBODY (ROUTINE TESTING W REFLEX): HIV 1&2 Ab, 4th Generation: NONREACTIVE

## 2022-04-07 LAB — HEPATITIS C ANTIBODY: Hepatitis C Ab: NONREACTIVE

## 2022-04-09 DIAGNOSIS — R8271 Bacteriuria: Secondary | ICD-10-CM | POA: Insufficient documentation

## 2022-04-09 LAB — CULTURE, OB URINE

## 2022-04-09 LAB — URINE CULTURE, OB REFLEX

## 2022-04-09 MED ORDER — CEFADROXIL 500 MG PO CAPS: 500.0000 mg | ORAL_CAPSULE | Freq: Two times a day (BID) | ORAL | 0 refills | Status: DC

## 2022-04-09 NOTE — Addendum Note (Signed)
Addended by: Milas Hock A on: 04/09/2022 01:14 PM   Modules accepted: Orders

## 2022-04-13 ENCOUNTER — Encounter: Payer: BC Managed Care – PPO | Admitting: Obstetrics and Gynecology

## 2022-04-23 ENCOUNTER — Encounter: Payer: Self-pay | Admitting: Obstetrics and Gynecology

## 2022-05-03 ENCOUNTER — Encounter: Payer: 59 | Admitting: Obstetrics and Gynecology

## 2022-05-08 NOTE — Progress Notes (Signed)
   PRENATAL VISIT NOTE  Subjective:  Patricia Barber is a 31 y.o. E1E0712 at [redacted]w[redacted]d being seen today for ongoing prenatal care.  She is currently monitored for the following issues for this low-risk pregnancy and has PCOS (polycystic ovarian syndrome); Chronic pelvic pain in female; Supervision of other normal pregnancy, antepartum; and GBS bacteriuria on their problem list.  Patient reports no complaints.  Had COVID a couple weeks ago - fevers, N/V. Doing very well now. Contractions: Not present. Vag. Bleeding: None.  Movement: Absent. Denies leaking of fluid.   The following portions of the patient's history were reviewed and updated as appropriate: allergies, current medications, past family history, past medical history, past social history, past surgical history and problem list.   Objective:   Vitals:   05/10/22 0928  BP: 117/75  Pulse: 81  Weight: 197 lb (89.4 kg)    Fetal Status: Fetal Heart Rate (bpm): 150   Movement: Absent     General:  Alert, oriented and cooperative. Patient is in no acute distress.  Skin: Skin is warm and dry. No rash noted.   Cardiovascular: Normal heart rate noted  Respiratory: Normal respiratory effort, no problems with respiration noted  Abdomen: Soft, gravid, appropriate for gestational age.  Pain/Pressure: Absent     Pelvic: Cervical exam deferred        Extremities: Normal range of motion.  Edema: None  Mental Status: Normal mood and affect. Normal behavior. Normal judgment and thought content.   Assessment and Plan:  Pregnancy: [id number] at [redacted]w[redacted]d 1. Supervision of other normal pregnancy, antepartum Schedule for anatomy scan Reviewed travel precautions.  Panorama ordered.  MSAFP ordered and she will go  Flu shot offered today and pt accepts   2. GBS bacteriuria PCN in labor  Preterm labor symptoms and general obstetric precautions including but not limited to vaginal bleeding, contractions, leaking of fluid and fetal movement were reviewed in  detail with the patient. Please refer to After Visit Summary for other counseling recommendations.   Return in about 8 weeks (around 07/05/2022) for Schedule [place] with MFM, OB VISIT, MD or APP.  No future appointments.   Milas Hock, MD

## 2022-05-10 ENCOUNTER — Encounter: Payer: Self-pay | Admitting: Obstetrics and Gynecology

## 2022-05-10 ENCOUNTER — Ambulatory Visit (INDEPENDENT_AMBULATORY_CARE_PROVIDER_SITE_OTHER): Payer: 59 | Admitting: Obstetrics and Gynecology

## 2022-05-10 VITALS — BP 117/75 | HR 81 | Wt 197.0 lb

## 2022-05-10 DIAGNOSIS — R8271 Bacteriuria: Secondary | ICD-10-CM

## 2022-05-10 DIAGNOSIS — Z3480 Encounter for supervision of other normal pregnancy, unspecified trimester: Secondary | ICD-10-CM

## 2022-05-10 DIAGNOSIS — Z3482 Encounter for supervision of other normal pregnancy, second trimester: Secondary | ICD-10-CM

## 2022-05-10 DIAGNOSIS — Z23 Encounter for immunization: Secondary | ICD-10-CM

## 2022-05-10 DIAGNOSIS — Z3A14 14 weeks gestation of pregnancy: Secondary | ICD-10-CM

## 2022-05-10 NOTE — Addendum Note (Signed)
Addended by: Kathie Dike on: 05/10/2022 10:19 AM   Modules accepted: Orders

## 2022-05-22 ENCOUNTER — Encounter: Payer: Self-pay | Admitting: *Deleted

## 2022-05-22 ENCOUNTER — Encounter: Payer: Self-pay | Admitting: Obstetrics and Gynecology

## 2022-05-22 LAB — PANORAMA PRENATAL TEST FULL PANEL:PANORAMA TEST PLUS 5 ADDITIONAL MICRODELETIONS: FETAL FRACTION: 5.6

## 2022-06-13 ENCOUNTER — Ambulatory Visit: Payer: 59

## 2022-06-13 ENCOUNTER — Ambulatory Visit: Payer: 59 | Attending: Obstetrics and Gynecology

## 2022-06-13 ENCOUNTER — Other Ambulatory Visit: Payer: Self-pay

## 2022-06-13 VITALS — BP 117/64 | HR 80

## 2022-06-13 DIAGNOSIS — Z3480 Encounter for supervision of other normal pregnancy, unspecified trimester: Secondary | ICD-10-CM

## 2022-06-13 DIAGNOSIS — O99212 Obesity complicating pregnancy, second trimester: Secondary | ICD-10-CM | POA: Insufficient documentation

## 2022-06-13 DIAGNOSIS — Z363 Encounter for antenatal screening for malformations: Secondary | ICD-10-CM | POA: Diagnosis not present

## 2022-06-13 DIAGNOSIS — Z362 Encounter for other antenatal screening follow-up: Secondary | ICD-10-CM

## 2022-06-13 DIAGNOSIS — Z3A19 19 weeks gestation of pregnancy: Secondary | ICD-10-CM | POA: Diagnosis not present

## 2022-06-13 DIAGNOSIS — E669 Obesity, unspecified: Secondary | ICD-10-CM

## 2022-06-13 DIAGNOSIS — E282 Polycystic ovarian syndrome: Secondary | ICD-10-CM

## 2022-06-18 LAB — HEPATITIS C ANTIBODY: HCV Ab: NEGATIVE

## 2022-07-13 ENCOUNTER — Ambulatory Visit (INDEPENDENT_AMBULATORY_CARE_PROVIDER_SITE_OTHER): Payer: 59 | Admitting: Certified Nurse Midwife

## 2022-07-13 VITALS — BP 108/71 | HR 79 | Wt 196.1 lb

## 2022-07-13 DIAGNOSIS — Z3480 Encounter for supervision of other normal pregnancy, unspecified trimester: Secondary | ICD-10-CM

## 2022-07-13 DIAGNOSIS — Z3482 Encounter for supervision of other normal pregnancy, second trimester: Secondary | ICD-10-CM

## 2022-07-13 DIAGNOSIS — Z3A23 23 weeks gestation of pregnancy: Secondary | ICD-10-CM

## 2022-07-13 NOTE — Progress Notes (Addendum)
Subjective:  Patricia Barber is a 31 y.o. [id number] at [redacted]w[redacted]d being seen today for ongoing prenatal care.  She is currently monitored for the following issues for this low-risk pregnancy and has Supervision of other normal pregnancy, antepartum and GBS bacteriuria on their problem list.  Patient reports heartburn.  Contractions: Not present. Vag. Bleeding: None.  Movement: Present. Denies leaking of fluid.   The following portions of the patient's history were reviewed and updated as appropriate: allergies, current medications, past family history, past medical history, past social history, past surgical history and problem list. Problem list updated.  Objective:   Vitals:   07/13/22 0925  BP: 108/71  Pulse: 79  Weight: 196 lb 1.3 oz (88.9 kg)    Fetal Status: Fetal Heart Rate (bpm): 154 Fundal Height: 23 cm Movement: Present     General:  Alert, oriented and cooperative. Patient is in no acute distress.  Skin: Skin is warm and dry. No rash noted.   Cardiovascular: Normal heart rate noted  Respiratory: Normal respiratory effort, no problems with respiration noted  Abdomen: Soft, gravid, appropriate for gestational age. Pain/Pressure: Present     Pelvic: Vag. Bleeding: None     Cervical exam deferred        Extremities: Normal range of motion.     Mental Status: Normal mood and affect. Normal behavior. Normal judgment and thought content.   Urinalysis:      Assessment and Plan:  Pregnancy: G4P2012 at [redacted]w[redacted]d  1. [redacted] weeks gestation of pregnancy  2. Supervision of other normal pregnancy, antepartum - f/u US next week for incomplete anatomy  3. Heartburn - diet changes - Pepcid daily  Preterm labor symptoms and general obstetric precautions including but not limited to vaginal bleeding, contractions, leaking of fluid and fetal movement were reviewed in detail with the patient. Please refer to After Visit Summary for other counseling recommendations.  Return in about 5 weeks (around  08/17/2022).   Donette Larry, CNM

## 2022-07-17 ENCOUNTER — Ambulatory Visit: Payer: 59 | Attending: Obstetrics

## 2022-07-17 ENCOUNTER — Ambulatory Visit: Payer: 59 | Admitting: *Deleted

## 2022-07-17 ENCOUNTER — Other Ambulatory Visit: Payer: Self-pay | Admitting: *Deleted

## 2022-07-17 VITALS — BP 105/48 | HR 71

## 2022-07-17 DIAGNOSIS — Z362 Encounter for other antenatal screening follow-up: Secondary | ICD-10-CM | POA: Insufficient documentation

## 2022-07-17 DIAGNOSIS — O99212 Obesity complicating pregnancy, second trimester: Secondary | ICD-10-CM | POA: Insufficient documentation

## 2022-07-17 DIAGNOSIS — E669 Obesity, unspecified: Secondary | ICD-10-CM

## 2022-07-17 DIAGNOSIS — Z3A23 23 weeks gestation of pregnancy: Secondary | ICD-10-CM

## 2022-07-17 DIAGNOSIS — E282 Polycystic ovarian syndrome: Secondary | ICD-10-CM | POA: Diagnosis present

## 2022-07-17 DIAGNOSIS — R638 Other symptoms and signs concerning food and fluid intake: Secondary | ICD-10-CM

## 2022-08-15 ENCOUNTER — Ambulatory Visit (INDEPENDENT_AMBULATORY_CARE_PROVIDER_SITE_OTHER): Payer: 59 | Admitting: Obstetrics and Gynecology

## 2022-08-15 VITALS — BP 95/64 | HR 77 | Wt 199.0 lb

## 2022-08-15 DIAGNOSIS — Z3483 Encounter for supervision of other normal pregnancy, third trimester: Secondary | ICD-10-CM | POA: Diagnosis not present

## 2022-08-15 DIAGNOSIS — Z23 Encounter for immunization: Secondary | ICD-10-CM

## 2022-08-15 DIAGNOSIS — Z3A28 28 weeks gestation of pregnancy: Secondary | ICD-10-CM | POA: Diagnosis not present

## 2022-08-15 DIAGNOSIS — Z3480 Encounter for supervision of other normal pregnancy, unspecified trimester: Secondary | ICD-10-CM

## 2022-08-15 NOTE — Progress Notes (Signed)
   PRENATAL VISIT NOTE  Subjective:  Patricia Barber is a 31 y.o. E7O3500 at [redacted]w[redacted]d being seen today for ongoing prenatal care.  She is currently monitored for the following issues for this low-risk pregnancy and has Supervision of other normal pregnancy, antepartum and GBS bacteriuria on their problem list.  Patient reports  lower abdominal discomfort - working with PFPT .  Contractions: Not present. Vag. Bleeding: None.  Movement: Present. Denies leaking of fluid.   The following portions of the patient's history were reviewed and updated as appropriate: allergies, current medications, past family history, past medical history, past social history, past surgical history and problem list.   Objective:   Vitals:   08/15/22 0812  BP: 95/64  Pulse: 77  Weight: 199 lb (90.3 kg)   Fetal Status: Fetal Heart Rate (bpm): 138 Fundal Height: 26 cm Movement: Present     General:  Alert, oriented and cooperative. Patient is in no acute distress.  Skin: Skin is warm and dry. No rash noted.   Cardiovascular: Normal heart rate noted  Respiratory: Normal respiratory effort, no problems with respiration noted  Abdomen: Soft, gravid, appropriate for gestational age.  Pain/Pressure: Absent      Assessment and Plan:  Pregnancy: [id number] at [redacted]w[redacted]d 1. Supervision of other normal pregnancy, antepartum - Glucose Tolerance, 2 Hours w/1 Hour - HIV Antibody (routine testing w rflx) - RPR - CBC - Tdap today  Preterm labor symptoms and general obstetric precautions including but not limited to vaginal bleeding, contractions, leaking of fluid and fetal movement were reviewed in detail with the patient.  Please refer to After Visit Summary for other counseling recommendations.   Return in about 2 weeks (around 08/29/2022) for LROB.  Future Appointments  Date Time Provider Department Center  08/31/2022 10:10 AM Donette Larry, CNM [facility]  09/13/2022  9:30 AM Lennart Pall, MD [facility]  [facility]  09/18/2022 10:45 AM [facility] NURSE [facility]  09/18/2022 11:00 AM [facility] US1 [facility]  09/27/2022  9:30 AM Constant, Gigi Gin, MD [facility]    Lennart Pall, MD

## 2022-08-17 ENCOUNTER — Encounter: Payer: Self-pay | Admitting: Obstetrics and Gynecology

## 2022-08-17 DIAGNOSIS — O24419 Gestational diabetes mellitus in pregnancy, unspecified control: Secondary | ICD-10-CM | POA: Insufficient documentation

## 2022-08-17 LAB — HIV ANTIBODY (ROUTINE TESTING W REFLEX): HIV Screen 4th Generation wRfx: NONREACTIVE

## 2022-08-17 LAB — CBC
Hematocrit: 32.5 % — ABNORMAL LOW (ref 34.0–46.6)
Hemoglobin: 10.9 g/dL — ABNORMAL LOW (ref 11.1–15.9)
MCH: 31.1 pg (ref 26.6–33.0)
MCHC: 33.5 g/dL (ref 31.5–35.7)
MCV: 93 fL (ref 79–97)
Platelets: 250 10*3/uL (ref 150–450)
RBC: 3.51 x10E6/uL — ABNORMAL LOW (ref 3.77–5.28)
RDW: 13 % (ref 11.7–15.4)
WBC: 6.9 10*3/uL (ref 3.4–10.8)

## 2022-08-17 LAB — GLUCOSE TOLERANCE, 2 HOURS W/ 1HR
Glucose, 1 hour: 131 mg/dL (ref 70–179)
Glucose, 2 hour: 132 mg/dL (ref 70–152)
Glucose, Fasting: 128 mg/dL — ABNORMAL HIGH (ref 70–91)

## 2022-08-17 LAB — RPR: RPR Ser Ql: NONREACTIVE

## 2022-08-22 ENCOUNTER — Other Ambulatory Visit: Payer: Self-pay | Admitting: *Deleted

## 2022-08-22 MED ORDER — FERROUS SULFATE 325 (65 FE) MG PO TBEC: 325.0000 mg | DELAYED_RELEASE_TABLET | Freq: Every day | ORAL | 6 refills | Status: DC

## 2022-08-27 NOTE — L&D Delivery Note (Signed)
OB/GYN Faculty Practice Delivery Note  Patricia Barber is a 32 y.o. [id number] s/p SVB at [redacted]w[redacted]d She was admitted for SROM/SOL.   ROM: 13h 220mith clear fluid (forebag AROM @ 2359, clear) GBS Status: pos Maximum Maternal Temperature: 98.9  Labor Progress: [name] admitted with SROM/latent labor. She was adequately prophylaxed with PCN and had nl CBG values. She progressed to 8cm at which time she had AROM of a forebag followed by the transition phase of labor and delivery on hands and knees in the tub.  Delivery Date/Time: March 14th, 2024 at 0053 Delivery: Called to room and patient was complete and pushing. Head delivered OA. No nuchal cord present. Shoulder and body delivered in usual fashion. Infant with spontaneous cry, placed on mother's abdomen, dried and stimulated. Cord clamped x 2 after >1-minute delay, and cut by FOB. Cord blood drawn. Pt assisted to the bed; placenta delivered spontaneously with gentle cord traction. Fundus firm with massage and Pitocin. Labia, perineum, vagina, and cervix inspected and found to have a sm skin separation at the introitus- hemestatic and not repaired.   Placenta: spont, intact; to L&D Complications: none Lacerations: (skin separation @ introitus; not repaired) EBL: 343cc Analgesia: none  Postpartum Planning '[x]'$  message to sent to schedule follow-up (already scheduled)  Infant: boy  APGARs 8/9  3150g (6lb 15.1oz)  KiMyrtis SerCNM  11/08/2022 1:35 AM

## 2022-08-31 ENCOUNTER — Ambulatory Visit (INDEPENDENT_AMBULATORY_CARE_PROVIDER_SITE_OTHER): Payer: 59 | Admitting: Certified Nurse Midwife

## 2022-08-31 VITALS — BP 109/70 | HR 86 | Wt 201.0 lb

## 2022-08-31 DIAGNOSIS — Z3483 Encounter for supervision of other normal pregnancy, third trimester: Secondary | ICD-10-CM

## 2022-08-31 DIAGNOSIS — Z3A30 30 weeks gestation of pregnancy: Secondary | ICD-10-CM

## 2022-08-31 DIAGNOSIS — O24415 Gestational diabetes mellitus in pregnancy, controlled by oral hypoglycemic drugs: Secondary | ICD-10-CM

## 2022-08-31 DIAGNOSIS — Z3480 Encounter for supervision of other normal pregnancy, unspecified trimester: Secondary | ICD-10-CM

## 2022-08-31 MED ORDER — BLOOD GLUCOSE MONITOR KIT: PACK | 0 refills | Status: DC

## 2022-08-31 NOTE — Progress Notes (Signed)
Subjective:  Patricia Barber is a 32 y.o. [id number] at [redacted]w[redacted]d being seen today for ongoing prenatal care.  She is currently monitored for the following issues for this low-risk pregnancy and has Supervision of other normal pregnancy, antepartum; GBS bacteriuria; and Gestational diabetes on their problem list.  Patient reports no complaints.  Contractions: Not present. Vag. Bleeding: None.  Movement: Present. Denies leaking of fluid.   The following portions of the patient's history were reviewed and updated as appropriate: allergies, current medications, past family history, past medical history, past social history, past surgical history and problem list. Problem list updated.  Objective:   Vitals:   08/31/22 1015  BP: 109/70  Pulse: 86  Weight: 201 lb (91.2 kg)    Fetal Status: Fetal Heart Rate (bpm): 149 Fundal Height: 30 cm Movement: Present  Presentation: Vertex  General:  Alert, oriented and cooperative. Patient is in no acute distress.  Skin: Skin is warm and dry. No rash noted.   Cardiovascular: Normal heart rate noted  Respiratory: Normal respiratory effort, no problems with respiration noted  Abdomen: Soft, gravid, appropriate for gestational age. Pain/Pressure: Absent     Pelvic: Vag. Bleeding: None Vag D/C Character: Thin   Cervical exam deferred        Extremities: Normal range of motion.  Edema: None  Mental Status: Normal mood and affect. Normal behavior. Normal judgment and thought content.   Urinalysis:      Assessment and Plan:  Pregnancy: G2I9485 at [redacted]w[redacted]d  1. Gestational diabetes mellitus (GDM) in third trimester controlled on oral hypoglycemic drug - FBS 79-91, pp 84-97; continue to log and bring to all visits - blood glucose meter kit and supplies KIT; Dispense based on patient and insurance preference. Use up to four times daily as directed.  Dispense: 1 each; Refill: 0 - Referral to Nutrition and Diabetes Services - MFM [place] scheduled  2. Supervision of other  normal pregnancy, antepartum  3. [redacted] weeks gestation of pregnancy  Preterm labor symptoms and general obstetric precautions including but not limited to vaginal bleeding, contractions, leaking of fluid and fetal movement were reviewed in detail with the patient. Please refer to After Visit Summary for other counseling recommendations.  Return in about 2 weeks (around 09/14/2022).   Julianne Handler, CNM

## 2022-09-13 ENCOUNTER — Ambulatory Visit (INDEPENDENT_AMBULATORY_CARE_PROVIDER_SITE_OTHER): Payer: 59 | Admitting: Registered"

## 2022-09-13 ENCOUNTER — Other Ambulatory Visit: Payer: Self-pay

## 2022-09-13 ENCOUNTER — Encounter: Payer: 59 | Attending: Certified Nurse Midwife | Admitting: Registered"

## 2022-09-13 ENCOUNTER — Encounter: Payer: Self-pay | Admitting: Obstetrics and Gynecology

## 2022-09-13 ENCOUNTER — Ambulatory Visit (INDEPENDENT_AMBULATORY_CARE_PROVIDER_SITE_OTHER): Payer: 59 | Admitting: Obstetrics and Gynecology

## 2022-09-13 VITALS — BP 119/72 | HR 99 | Wt 204.0 lb

## 2022-09-13 DIAGNOSIS — Z302 Encounter for sterilization: Secondary | ICD-10-CM | POA: Insufficient documentation

## 2022-09-13 DIAGNOSIS — O24415 Gestational diabetes mellitus in pregnancy, controlled by oral hypoglycemic drugs: Secondary | ICD-10-CM | POA: Diagnosis present

## 2022-09-13 DIAGNOSIS — R8271 Bacteriuria: Secondary | ICD-10-CM | POA: Diagnosis not present

## 2022-09-13 DIAGNOSIS — O26893 Other specified pregnancy related conditions, third trimester: Secondary | ICD-10-CM | POA: Insufficient documentation

## 2022-09-13 DIAGNOSIS — O24410 Gestational diabetes mellitus in pregnancy, diet controlled: Secondary | ICD-10-CM

## 2022-09-13 DIAGNOSIS — Z3A32 32 weeks gestation of pregnancy: Secondary | ICD-10-CM

## 2022-09-13 DIAGNOSIS — Z3480 Encounter for supervision of other normal pregnancy, unspecified trimester: Secondary | ICD-10-CM

## 2022-09-13 MED ORDER — ACCU-CHEK SOFTCLIX LANCETS MISC: 1.0000 | Freq: Four times a day (QID) | 12 refills | Status: DC

## 2022-09-13 MED ORDER — D-CARE BLOOD GLUCOSE VI STRP: ORAL_STRIP | 12 refills | Status: DC

## 2022-09-13 MED ORDER — BLOOD GLUCOSE MONITOR KIT: PACK | 0 refills | Status: DC

## 2022-09-13 MED ORDER — D-CARE GLUCOMETER W/DEVICE KIT: 1.0000 | PACK | Freq: Four times a day (QID) | 0 refills | Status: DC

## 2022-09-13 NOTE — Progress Notes (Signed)
Patient was seen for Gestational Diabetes self-management on 09/13/22  Start time 1058 and End time 1152   Patient receives care at [facility] location  Estimated due date: 11/07/22; [redacted]w[redacted]d  Clinical: Medications: iron, prenatal, vit D Medical History: PCOS (pt reported) Labs: OGTT 128(H)-131-132; A1c 5.5% 03/05/22  Dietary and Lifestyle History: Pt states she has PCOS and it took a long time to be diagnosed. Pt reports taking metformin prior to pregnancy and took inositol after stopping metformin. Pt is not taking either currently. Pt had questions about the pathophysiology of PCOS, roles of insulin and testosterone. Pt states PCOS is common in her circle of friends and family.  Pt states she has been waiting 2 hours after eating meals before having snacks in between meals.  Physical Activity: works with Physiological scientist 3x/week for 60 min Stress: not assessed Sleep: 8-10 hours  24 hr Recall:  not assessed First Meal:  Snack: Second meal: Snack: Third meal: Snack: Beverages:  NUTRITION INTERVENTION  Nutrition education (E-1) on the following topics:   Initial Follow-up  [x]  []  Definition of Gestational Diabetes []  []  Why dietary management is important in controlling blood glucose [x]  []  Effects each nutrient has on blood glucose levels [x]  []  Simple carbohydrates vs complex carbohydrates []  []  Fluid intake [x]  []  Creating a balanced meal plan [x]  []  Carbohydrate counting  [x]  []  When to check blood glucose levels [x]  []  Proper blood glucose monitoring techniques [x]  []  Effect of stress and stress reduction techniques  [x]  []  Exercise effect on blood glucose levels, appropriate exercise during pregnancy [x]  []  Importance of limiting caffeine and abstaining from alcohol and smoking []  []  Medications used for blood sugar control during pregnancy [x]  []  Hypoglycemia and rule of 15 [x]  []  Postpartum self care  Patient has a meter prior to visit. Patient is testing FBS and 2  hours after some meal. FBS: 80s-90s PPBG is not clear looking at log typed in phone  Patient instructed to monitor glucose levels: FBS: 60 - ? 95 mg/dL; 2 hour: ? 120 mg/dL  Patient received handouts: Nutrition Diabetes and Pregnancy Carbohydrate Counting List  Patient will be seen for follow-up as needed.

## 2022-09-13 NOTE — Addendum Note (Signed)
Addended by: Lyndal Rainbow on: 09/13/2022 10:47 AM   Modules accepted: Orders

## 2022-09-13 NOTE — Progress Notes (Signed)
   PRENATAL VISIT NOTE  Subjective:  Patricia Barber is a 32 y.o. [id number] at [redacted]w[redacted]d being seen today for ongoing prenatal care.  She is currently monitored for the following issues for this low-risk pregnancy and has Supervision of other normal pregnancy, antepartum; GBS bacteriuria; Gestational diabetes; and Request for sterilization on their problem list.  Patient reports no complaints.  Contractions: Not present. Vag. Bleeding: None.  Movement: Present. Denies leaking of fluid.   The following portions of the patient's history were reviewed and updated as appropriate: allergies, current medications, past family history, past medical history, past social history, past surgical history and problem list.   Objective:   Vitals:   09/13/22 0930  BP: 119/72  Pulse: 99  Weight: 204 lb (92.5 kg)    Fetal Status: Fetal Heart Rate (bpm): 156 Fundal Height: 33 cm Movement: Present     General:  Alert, oriented and cooperative. Patient is in no acute distress.  Skin: Skin is warm and dry. No rash noted.   Cardiovascular: Normal heart rate noted  Respiratory: Normal respiratory effort, no problems with respiration noted  Abdomen: Soft, gravid, appropriate for gestational age.  Pain/Pressure: Absent      Assessment and Plan:  Pregnancy: [id number] at [redacted]w[redacted]d 1. Supervision of other normal pregnancy, antepartum 2. [redacted] weeks gestation of pregnancy Growth [place] scheduled 1/23  3. Diet controlled gestational diabetes mellitus (GDM) in third trimester BG reviewed - all within range Serual growth [place] - scheduled 1/23  4. GBS bacteriuria Discussed PCN in labor  5. Request for sterilization Considering tubal vs vasectomy MA-31 not needed - private insurance  Please refer to After Visit Summary for other counseling recommendations.   Return in about 2 weeks (around 09/27/2022) for return OB at 34 weeks.  Future Appointments  Date Time Provider [place]  09/13/2022 11:15 AM [facility]  [facility]  09/18/2022 10:45 AM [facility] NURSE [facility]  09/18/2022 11:00 AM [facility] US1 [facility]  09/27/2022  9:30 AM Constant, Vickii Chafe, MD [facility]    Inez Catalina, MD

## 2022-09-18 ENCOUNTER — Other Ambulatory Visit: Payer: Self-pay | Admitting: Obstetrics and Gynecology

## 2022-09-18 ENCOUNTER — Other Ambulatory Visit: Payer: Self-pay

## 2022-09-18 ENCOUNTER — Ambulatory Visit: Payer: 59 | Attending: Obstetrics and Gynecology

## 2022-09-18 ENCOUNTER — Ambulatory Visit (HOSPITAL_BASED_OUTPATIENT_CLINIC_OR_DEPARTMENT_OTHER): Payer: 59 | Admitting: Maternal & Fetal Medicine

## 2022-09-18 ENCOUNTER — Ambulatory Visit: Payer: 59

## 2022-09-18 VITALS — BP 119/66 | HR 71

## 2022-09-18 DIAGNOSIS — Z3A32 32 weeks gestation of pregnancy: Secondary | ICD-10-CM | POA: Insufficient documentation

## 2022-09-18 DIAGNOSIS — O365930 Maternal care for other known or suspected poor fetal growth, third trimester, not applicable or unspecified: Secondary | ICD-10-CM | POA: Diagnosis not present

## 2022-09-18 DIAGNOSIS — R638 Other symptoms and signs concerning food and fluid intake: Secondary | ICD-10-CM

## 2022-09-18 DIAGNOSIS — O99212 Obesity complicating pregnancy, second trimester: Secondary | ICD-10-CM | POA: Diagnosis present

## 2022-09-18 DIAGNOSIS — Z3480 Encounter for supervision of other normal pregnancy, unspecified trimester: Secondary | ICD-10-CM | POA: Insufficient documentation

## 2022-09-18 DIAGNOSIS — O99213 Obesity complicating pregnancy, third trimester: Secondary | ICD-10-CM

## 2022-09-18 DIAGNOSIS — O24410 Gestational diabetes mellitus in pregnancy, diet controlled: Secondary | ICD-10-CM | POA: Diagnosis not present

## 2022-09-18 DIAGNOSIS — E669 Obesity, unspecified: Secondary | ICD-10-CM

## 2022-09-18 DIAGNOSIS — Z362 Encounter for other antenatal screening follow-up: Secondary | ICD-10-CM | POA: Insufficient documentation

## 2022-09-18 NOTE — Progress Notes (Signed)
MFM Consult Note Patient Name: Patricia Barber  Patient MRN:   [id number]  Referring provider: Mahlon Gammon  Reason for Consult: gDM, low fetal growth   HPI: Patricia Barber is a 32 y.o. O1B5102 at [redacted]w[redacted]d here for ultrasound and consultation.   There is concern for poor fetal growth based on today's [place]. Some of the initial AC measurements were < 10%, but subsequent images show the growth is more in the 10-15% range.  Umbilical artery Dopplers were normal. BPP was 8/8. I discussed that in the setting of gestational diabetes it is typical to have a larger fetus, but this is not always the case especially with normal glucose control.  We discussed the difference between fetal growth restriction versus poor fetal growth potential.  Since the subsequent measurements of the abdominal circumference were normal and the overall growth is normal the patient felt most comfortable with performing a growth in 3 weeks and declined any further antenatal testing.  I think this is reasonable given the growth percentiles.  We discussed strict fetal movement precautions as well as her blood sugar log.  We discussed the risk of fetal hypoxia and stillbirth with poor glucose control and growth restriction. She reports that her blood sugars are well-controlled.  I discussed the importance of continued ultrasound assessment and delivery timing.  Able to ask questions that were answered to her satisfaction.  Review of Systems: A review of systems was performed and was negative except per HPI   Vitals and Physical Exam 119/66, HR 71, BMI 35. Sitting comfortably on the sonogram table Nonlabored breathing Normal rate and rhythm Abdomen is nontender  Sonographic findings Single intrauterine pregnancy. Fetal cardiac activity:  Observed and appears normal. Presentation: Cephalic. Interval fetal anatomy appears normal. Fetal biometry shows the estimated fetal weight at the 26 percentile and the abdominal circumference at the 11th  percentile.  Amniotic fluid volume: Within normal limits. AFI: 16.62 cm.  MVP: 5.8 cm. Placenta: Anterior. Umbilical artery dopplers findings: -S/D:2.36 which are normal at this gestational age.  -Absent end-diastolic flow: No.  -Reversed end-diastolic flow:  No. BPP 8/8  Genetic testing: low risk NIPS  Assessment - Poor fetal growth  - gDMA1 Plan - Repeat growth in 3 weeks - Offer antenatal testing but the patient declined. I think this is reasonable since the AC/overall growth is > 10% and her blood sugars are well controlled - Delivery timing will depend on next ultrasound in 3 weeks  - Continue glucose log (excellent control currently)  I spent 45 minutes reviewing the patients chart, including labs and images as well as counseling the patient about her medical conditions.  Valeda Malm  MFM, [place]   09/18/2022  12:51 PM

## 2022-09-27 ENCOUNTER — Ambulatory Visit (INDEPENDENT_AMBULATORY_CARE_PROVIDER_SITE_OTHER): Payer: 59 | Admitting: Obstetrics and Gynecology

## 2022-09-27 ENCOUNTER — Encounter: Payer: Self-pay | Admitting: Obstetrics and Gynecology

## 2022-09-27 VITALS — BP 106/60 | HR 85 | Wt 204.0 lb

## 2022-09-27 DIAGNOSIS — Z3480 Encounter for supervision of other normal pregnancy, unspecified trimester: Secondary | ICD-10-CM

## 2022-09-27 DIAGNOSIS — O24410 Gestational diabetes mellitus in pregnancy, diet controlled: Secondary | ICD-10-CM

## 2022-09-27 DIAGNOSIS — Z302 Encounter for sterilization: Secondary | ICD-10-CM

## 2022-09-27 DIAGNOSIS — R8271 Bacteriuria: Secondary | ICD-10-CM

## 2022-09-27 DIAGNOSIS — Z3A34 34 weeks gestation of pregnancy: Secondary | ICD-10-CM

## 2022-09-27 NOTE — Progress Notes (Signed)
   PRENATAL VISIT NOTE  Subjective:  Patricia Barber is a 32 y.o. [id number] at [redacted]w[redacted]d being seen today for ongoing prenatal care.  She is currently monitored for the following issues for this high-risk pregnancy and has Supervision of other normal pregnancy, antepartum; GBS bacteriuria; Gestational diabetes; and Request for sterilization on their problem list.  Patient reports no complaints.  Contractions: Not present. Vag. Bleeding: None.  Movement: Present. Denies leaking of fluid.   The following portions of the patient's history were reviewed and updated as appropriate: allergies, current medications, past family history, past medical history, past social history, past surgical history and problem list.   Objective:   Vitals:   09/27/22 0934  BP: 106/60  Pulse: 85  Weight: 204 lb (92.5 kg)    Fetal Status: Fetal Heart Rate (bpm): 149   Movement: Present     General:  Alert, oriented and cooperative. Patient is in no acute distress.  Skin: Skin is warm and dry. No rash noted.   Cardiovascular: Normal heart rate noted  Respiratory: Normal respiratory effort, no problems with respiration noted  Abdomen: Soft, gravid, appropriate for gestational age.  Pain/Pressure: Absent     Pelvic: Cervical exam deferred        Extremities: Normal range of motion.  Edema: None  Mental Status: Normal mood and affect. Normal behavior. Normal judgment and thought content.   Assessment and Plan:  Pregnancy: [id number] at [redacted]w[redacted]d 1. Supervision of other normal pregnancy, antepartum Patient is doing well without complaints Breech on last scan- follow up 2/13 scan for presentation  2. Diet controlled gestational diabetes mellitus (GDM) in third trimester CBGs well controlled on diet. All values within range with the exception of one pp 135 and patient admits to eating potatoes Follow up growth ultrasound on 2/13  3. GBS bacteriuria Prophylaxis in labor  4. Request for sterilization Patient is considering  [employer]  Preterm labor symptoms and general obstetric precautions including but not limited to vaginal bleeding, contractions, leaking of fluid and fetal movement were reviewed in detail with the patient. Please refer to After Visit Summary for other counseling recommendations.   Return in about 2 weeks (around 10/11/2022) for in person, ROB, High risk.  Future Appointments  Date Time Provider [place]  10/09/2022  9:30 AM [facility] NURSE [facility]  10/09/2022  9:45 AM [facility] US6 [facility]    Mora Bellman, MD

## 2022-10-09 ENCOUNTER — Ambulatory Visit: Payer: 59 | Attending: Maternal & Fetal Medicine

## 2022-10-09 ENCOUNTER — Ambulatory Visit: Payer: 59

## 2022-10-09 VITALS — BP 120/66 | HR 81

## 2022-10-09 DIAGNOSIS — Z3480 Encounter for supervision of other normal pregnancy, unspecified trimester: Secondary | ICD-10-CM | POA: Insufficient documentation

## 2022-10-09 DIAGNOSIS — O365930 Maternal care for other known or suspected poor fetal growth, third trimester, not applicable or unspecified: Secondary | ICD-10-CM | POA: Diagnosis not present

## 2022-10-09 DIAGNOSIS — O24419 Gestational diabetes mellitus in pregnancy, unspecified control: Secondary | ICD-10-CM | POA: Diagnosis not present

## 2022-10-09 DIAGNOSIS — O24410 Gestational diabetes mellitus in pregnancy, diet controlled: Secondary | ICD-10-CM | POA: Insufficient documentation

## 2022-10-09 DIAGNOSIS — Z3A35 35 weeks gestation of pregnancy: Secondary | ICD-10-CM | POA: Diagnosis not present

## 2022-10-09 DIAGNOSIS — E669 Obesity, unspecified: Secondary | ICD-10-CM | POA: Diagnosis not present

## 2022-10-09 DIAGNOSIS — O99213 Obesity complicating pregnancy, third trimester: Secondary | ICD-10-CM | POA: Insufficient documentation

## 2022-10-10 NOTE — Progress Notes (Unsigned)
   PRENATAL VISIT NOTE  Subjective:  Patricia Barber is a 32 y.o. [id number] at [redacted]w[redacted]d being seen today for ongoing prenatal care.  She is currently monitored for the following issues for this high-risk pregnancy and has Supervision of other normal pregnancy, antepartum; GBS bacteriuria; Gestational diabetes; and Request for sterilization on their problem list.  Patient reports {sx:14538}.   .  .   . Denies leaking of fluid.   The following portions of the patient's history were reviewed and updated as appropriate: allergies, current medications, past family history, past medical history, past social history, past surgical history and problem list.   Objective:  There were no vitals filed for this visit.  Fetal Status:           General:  Alert, oriented and cooperative. Patient is in no acute distress.  Skin: Skin is warm and dry. No rash noted.   Cardiovascular: Normal heart rate noted  Respiratory: Normal respiratory effort, no problems with respiration noted  Abdomen: Soft, gravid, appropriate for gestational age.        Pelvic: Cervical exam deferred        Extremities: Normal range of motion.     Mental Status: Normal mood and affect. Normal behavior. Normal judgment and thought content.   Assessment and Plan:  Pregnancy: [id number] at [redacted]w[redacted]d 1. Supervision of other normal pregnancy, antepartum GC/CT collected today.   2. GBS bacteriuria PCN in labor  3. Request for sterilization Reviewed desire for this. Pt ***  4. Diet controlled gestational diabetes mellitus (GDM) in third trimester Current regimen:  Diet CBG review: *** Regimen changes: {Blank multiple:19196::"***","none"} Growth [place]: 2/13 - was normal, 58%ile, AC 45%ile, Normal AFI, cephalic.  Antenatal monitoring: Continue weekly testing - will do office NST.  Other needs: None   Preterm labor symptoms and general obstetric precautions including but not limited to vaginal bleeding, contractions, leaking of fluid and fetal  movement were reviewed in detail with the patient. Please refer to After Visit Summary for other counseling recommendations.   No follow-ups on file.  Future Appointments  Date Time Provider [place]  10/11/2022 10:30 AM Radene Gunning, MD [facility]  10/18/2022 10:30 AM Inez Catalina, MD [facility]  10/25/2022 10:30 AM Radene Gunning, MD [facility]  11/05/2022  9:50 AM Gala Romney, Fredderick Phenix, MD [facility]    Radene Gunning, MD

## 2022-10-11 ENCOUNTER — Ambulatory Visit (INDEPENDENT_AMBULATORY_CARE_PROVIDER_SITE_OTHER): Payer: 59 | Admitting: Obstetrics and Gynecology

## 2022-10-11 ENCOUNTER — Encounter: Payer: Self-pay | Admitting: Obstetrics and Gynecology

## 2022-10-11 ENCOUNTER — Other Ambulatory Visit (HOSPITAL_COMMUNITY)
Admission: RE | Admit: 2022-10-11 | Discharge: 2022-10-11 | Disposition: A | Discharge status: Home / Self Care | Payer: 59 | Source: Ambulatory Visit | Attending: Obstetrics and Gynecology | Admitting: Obstetrics and Gynecology

## 2022-10-11 VITALS — BP 113/68 | HR 68 | Wt 207.0 lb

## 2022-10-11 DIAGNOSIS — Z3A36 36 weeks gestation of pregnancy: Secondary | ICD-10-CM

## 2022-10-11 DIAGNOSIS — O24410 Gestational diabetes mellitus in pregnancy, diet controlled: Secondary | ICD-10-CM

## 2022-10-11 DIAGNOSIS — Z3480 Encounter for supervision of other normal pregnancy, unspecified trimester: Secondary | ICD-10-CM | POA: Insufficient documentation

## 2022-10-11 DIAGNOSIS — Z302 Encounter for sterilization: Secondary | ICD-10-CM

## 2022-10-11 DIAGNOSIS — R8271 Bacteriuria: Secondary | ICD-10-CM

## 2022-10-12 LAB — CERVICOVAGINAL ANCILLARY ONLY
Chlamydia: NEGATIVE
Comment: NEGATIVE
Comment: NORMAL
Neisseria Gonorrhea: NEGATIVE

## 2022-10-18 ENCOUNTER — Encounter: Payer: Self-pay | Admitting: Obstetrics and Gynecology

## 2022-10-18 ENCOUNTER — Telehealth (INDEPENDENT_AMBULATORY_CARE_PROVIDER_SITE_OTHER): Payer: 59 | Admitting: Obstetrics and Gynecology

## 2022-10-18 VITALS — BP 112/78 | HR 85 | Wt 206.0 lb

## 2022-10-18 DIAGNOSIS — Z3A37 37 weeks gestation of pregnancy: Secondary | ICD-10-CM

## 2022-10-18 DIAGNOSIS — R8271 Bacteriuria: Secondary | ICD-10-CM

## 2022-10-18 DIAGNOSIS — Z302 Encounter for sterilization: Secondary | ICD-10-CM

## 2022-10-18 DIAGNOSIS — O24410 Gestational diabetes mellitus in pregnancy, diet controlled: Secondary | ICD-10-CM

## 2022-10-18 DIAGNOSIS — Z3480 Encounter for supervision of other normal pregnancy, unspecified trimester: Secondary | ICD-10-CM

## 2022-10-18 NOTE — Progress Notes (Signed)
Copied from MyChart message: BP 112/78, pulse 85 Weight 206.3 lbs

## 2022-10-18 NOTE — Progress Notes (Signed)
   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  She is currently monitored for the following issues for this low-risk pregnancy and has Supervision of other normal pregnancy, antepartum; GBS bacteriuria; Gestational diabetes; and Request for sterilization on their problem list.  Patient reports no complaints.  Contractions: Irritability. Vag. Bleeding: None.  Movement: Present. Denies leaking of fluid.   The following portions of the patient's history were reviewed and updated as appropriate: allergies, current medications, past family history, past medical history, past social history, past surgical history and problem list.   Objective:  There were no vitals filed for this visit.  Fetal Status:     Movement: Present     General:  Alert, oriented and cooperative. Patient is in no acute distress.  Skin: Skin is warm and dry. No rash noted.   Cardiovascular: Normal heart rate noted  Respiratory: Normal respiratory effort, no problems with respiration noted  Abdomen: Soft, gravid, appropriate for gestational age.  Pain/Pressure: Absent      Assessment and Plan:  Pregnancy: [id number] 363w1d. Supervision of other normal pregnancy, antepartum 37 weeks Pt had not taken weight or BP, but will send MyChart message later today  2. Diet controlled gestational diabetes mellitus (GDM) in third trimester Reports all normal BG Last growth @35$ /6 AGA Antenatal testing no longer indicated in s/o good diet control Reviewed delivery by 40 weeks. Agreeable to ordering IOL at next visit. Has labored after membrane sweep in past, hoping we can do that and avoid IOL  3. GBS bacteriuria For PCN in labor  4. Request for sterilization Undecided on if she will get tubal in hospital, but will let usKoreanow on day of delivery  Term labor symptoms and general obstetric precautions including but not limited to vaginal bleeding,  contractions, leaking of fluid and fetal movement were reviewed in detail with the patient.  Future Appointments  Date Time Provider [place]2/29/2024 10:30 AM DuRadene GunningMD [facility]/06/2023  9:50 AM LeGala RomneyKeFredderick PhenixMD [facility]  KyInez CatalinaMD

## 2022-10-24 NOTE — Progress Notes (Unsigned)
   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  She is currently monitored for the following issues for this {Blank single:19197::"high-risk","low-risk"} pregnancy and has Supervision of other normal pregnancy, antepartum; GBS bacteriuria; Gestational diabetes; and Request for sterilization on their problem list.  Patient reports {sx:14538}.   .  .   . Denies leaking of fluid.   The following portions of the patient's history were reviewed and updated as appropriate: allergies, current medications, past family history, past medical history, past social history, past surgical history and problem list.   Objective:  There were no vitals filed for this visit.  Fetal Status:           General:  Alert, oriented and cooperative. Patient is in no acute distress.  Skin: Skin is warm and dry. No rash noted.   Cardiovascular: Normal heart rate noted  Respiratory: Normal respiratory effort, no problems with respiration noted  Abdomen: Soft, gravid, appropriate for gestational age.        Pelvic: Cervical exam performed in the presence of a chaperone        Extremities: Normal range of motion.     Mental Status: Normal mood and affect. Normal behavior. Normal judgment and thought content.   Assessment and Plan:  Pregnancy: [id number] 354w1d. Diet controlled gestational diabetes mellitus (GDM) in third trimester CBGs reviewed: *** Diet controlled Discussed timing of delivery due to hopes for waterbirth - ***  2. Supervision of other normal pregnancy, antepartum Continue routine PNC.   3. GBS bacteriuria PCN in labor  Term labor symptoms and general obstetric precautions including but not limited to vaginal bleeding, contractions, leaking of fluid and fetal movement were reviewed in detail with the patient. Please refer to After Visit Summary for other counseling recommendations.   No follow-ups on file.  Future  Appointments  Date Time Provider [place]2/29/2024 10:30 AM DuRadene GunningMD [facility]/06/2023  9:50 AM LeGala RomneyKeFredderick PhenixMD [facility]  PaRadene GunningMD

## 2022-10-25 ENCOUNTER — Ambulatory Visit (INDEPENDENT_AMBULATORY_CARE_PROVIDER_SITE_OTHER): Payer: 59 | Admitting: Obstetrics and Gynecology

## 2022-10-25 ENCOUNTER — Encounter: Payer: Self-pay | Admitting: Obstetrics and Gynecology

## 2022-10-25 VITALS — BP 110/69 | HR 83 | Wt 205.0 lb

## 2022-10-25 DIAGNOSIS — Z3A38 38 weeks gestation of pregnancy: Secondary | ICD-10-CM

## 2022-10-25 DIAGNOSIS — O24410 Gestational diabetes mellitus in pregnancy, diet controlled: Secondary | ICD-10-CM

## 2022-10-25 DIAGNOSIS — R8271 Bacteriuria: Secondary | ICD-10-CM

## 2022-10-25 DIAGNOSIS — Z3480 Encounter for supervision of other normal pregnancy, unspecified trimester: Secondary | ICD-10-CM

## 2022-10-25 NOTE — Patient Instructions (Signed)
Congratulations, you're on your way to having your baby!!!   You now have an induction scheduled.   If your induction is scheduled for midnight, you will arrive at 11:45pm on the date provided on the form form the clinical staff today. You will arrive at the [place] at [facility] (Entrance C) and tell them you are there for your induction. The hospital will only call you to not come if they have NO beds available and need to postpone your admission time.    You will also get a call from the pre-admission nurse to go over pre-admission screen about 2 days prior to your induction date.

## 2022-10-30 ENCOUNTER — Ambulatory Visit (INDEPENDENT_AMBULATORY_CARE_PROVIDER_SITE_OTHER): Payer: 59 | Admitting: Obstetrics and Gynecology

## 2022-10-30 VITALS — BP 104/70 | HR 82 | Wt 208.0 lb

## 2022-10-30 DIAGNOSIS — Z3A38 38 weeks gestation of pregnancy: Secondary | ICD-10-CM

## 2022-10-30 DIAGNOSIS — O0993 Supervision of high risk pregnancy, unspecified, third trimester: Secondary | ICD-10-CM

## 2022-10-30 DIAGNOSIS — O24410 Gestational diabetes mellitus in pregnancy, diet controlled: Secondary | ICD-10-CM

## 2022-10-30 NOTE — Progress Notes (Signed)
   PRENATAL VISIT NOTE  Subjective:  Patricia Barber is a 32 y.o. [id number] at 79w6dbeing seen today for ongoing prenatal care.  She is currently monitored for the following issues for this high-risk pregnancy and has Supervision of other normal pregnancy, antepartum; GBS bacteriuria; Gestational diabetes; and Request for sterilization on their problem list.  Patient reports no complaints.  Contractions: Irritability. Vag. Bleeding: None.  Movement: Present. Denies leaking of fluid.   The following portions of the patient's history were reviewed and updated as appropriate: allergies, current medications, past family history, past medical history, past social history, past surgical history and problem list.   Objective:   Vitals:   10/30/22 1410  BP: 104/70  Pulse: 82  Weight: 208 lb (94.3 kg)    Fetal Status: Fetal Heart Rate (bpm): 149 Fundal Height: 38 cm Movement: Present     General:  Alert, oriented and cooperative. Patient is in no acute distress.  Skin: Skin is warm and dry. No rash noted.   Cardiovascular: Normal heart rate noted  Respiratory: Normal respiratory effort, no problems with respiration noted  Abdomen: Soft, gravid, appropriate for gestational age.  Pain/Pressure: Absent     Pelvic: Cervical exam completed with membrane sweep per patient request. Cervix 2 cm, 60%, -3. Exam by JNoni Saupe NP   Extremities: Normal range of motion.  Edema: None  Mental Status: Normal mood and affect. Normal behavior. Normal judgment and thought content.   Assessment and Plan:  Pregnancy: [id number] 382w6d-No blood sugar log today. -Reports that she has not checked her BS in 2-3 weeks.  -Reports all normal readings throughout 3rd trimester and she was instructed it was ok to no long check BS given normalcy of readings.  -Last MFM USKoreahows fetus @ 2800 grams + good fetal movement Discussed the importance of checking BS throughout remainder of pregnancy, during labor, and PP while  in the hospital. Discussed the importance of 2 hour Glucose testing PP. Discussed that uncontrolled, non compliance with GDM, [facility] that [facility] would  be discussed further and [facility] She has agreed to check her BS fasting, 2 hours after every meal for the next week and will  bring a log with her Tuesday.  -Discussed the option of foley bulb placement in the office prior to induction. She would like to discuss this more with CNM.  -Labor precautions.  -Recommend miles circuit and primrose oil.    Term labor symptoms and general obstetric precautions including but not limited to vaginal bleeding, contractions, leaking of fluid and fetal movement were reviewed in detail with the patient. Please refer to After Visit Summary for other counseling recommendations.   No follow-ups on file.  Future Appointments  Date Time Provider DeHazel Dell3/07/2023 10:30 AM BhJulianne HandlerCNM [facility]3/16/2024 12:00 AM [facility] None    JeNoni SaupeNP

## 2022-11-01 ENCOUNTER — Other Ambulatory Visit: Payer: Self-pay | Admitting: Obstetrics and Gynecology

## 2022-11-01 ENCOUNTER — Encounter: Payer: 59 | Admitting: Obstetrics and Gynecology

## 2022-11-01 DIAGNOSIS — O0993 Supervision of high risk pregnancy, unspecified, third trimester: Secondary | ICD-10-CM

## 2022-11-05 ENCOUNTER — Encounter: Payer: 59 | Admitting: Obstetrics & Gynecology

## 2022-11-06 ENCOUNTER — Ambulatory Visit (INDEPENDENT_AMBULATORY_CARE_PROVIDER_SITE_OTHER): Payer: 59 | Admitting: Certified Nurse Midwife

## 2022-11-06 ENCOUNTER — Telehealth (HOSPITAL_COMMUNITY): Payer: Self-pay | Admitting: *Deleted

## 2022-11-06 ENCOUNTER — Encounter (HOSPITAL_COMMUNITY): Payer: Self-pay | Admitting: *Deleted

## 2022-11-06 VITALS — BP 124/75 | HR 84 | Wt 208.0 lb

## 2022-11-06 DIAGNOSIS — O24410 Gestational diabetes mellitus in pregnancy, diet controlled: Secondary | ICD-10-CM

## 2022-11-06 DIAGNOSIS — Z3A39 39 weeks gestation of pregnancy: Secondary | ICD-10-CM

## 2022-11-06 DIAGNOSIS — Z3480 Encounter for supervision of other normal pregnancy, unspecified trimester: Secondary | ICD-10-CM

## 2022-11-06 NOTE — Progress Notes (Signed)
Subjective:  Patricia Barber is a 32 y.o. [id number] at 24w6dbeing seen today for ongoing prenatal care.  She is currently monitored for the following issues for this low-risk pregnancy and has Supervision of other normal pregnancy, antepartum; GBS bacteriuria; Gestational diabetes; and Request for sterilization on their problem list.  Patient reports no complaints.  Contractions: Irritability. Vag. Bleeding: None.  Movement: Present. Denies leaking of fluid.   The following portions of the patient's history were reviewed and updated as appropriate: allergies, current medications, past family history, past medical history, past social history, past surgical history and problem list. Problem list updated.  Objective:   Vitals:   11/06/22 1036  BP: 124/75  Pulse: 84  Weight: 208 lb (94.3 kg)    Fetal Status: Fetal Heart Rate (bpm): 148 Fundal Height: 38 cm Movement: Present  Presentation: Vertex  General:  Alert, oriented and cooperative. Patient is in no acute distress.  Skin: Skin is warm and dry. No rash noted.   Cardiovascular: Normal heart rate noted  Respiratory: Normal respiratory effort, no problems with respiration noted  Abdomen: Soft, gravid, appropriate for gestational age. Pain/Pressure: Absent     Pelvic: Vag. Bleeding: None Vag D/C Character: Thin   Cervical exam performed Dilation: 4.5 Effacement (%): 60 Station: -2  Extremities: Normal range of motion.     Mental Status: Normal mood and affect. Normal behavior. Normal judgment and thought content.   Urinalysis:      Assessment and Plan:  Pregnancy: [id number] 331w6d1. Diet controlled gestational diabetes mellitus (GDM) in third trimester - well controlled - past week log>FBS:80-86, PP: 75-129, 127, 121 (2 elevated in same day due to pizza and croissant)  2. Supervision of other normal pregnancy, antepartum - planning waterbirth - no barriers identified at this time - discussed signing consent at time of admission as  long as no barriers - still undecided on BTL vs vasectomy, to discuss with partner further  3. [redacted] weeks gestation of pregnancy - requested membrane sweep, performed - rec miles circuit - IOL scheduled for 11/10/22  Term labor symptoms and general obstetric precautions including but not limited to vaginal bleeding, contractions, leaking of fluid and fetal movement were reviewed in detail with the patient. Please refer to After Visit Summary for other counseling recommendations.  No follow-ups on file.   BhJulianne HandlerCNM

## 2022-11-06 NOTE — Telephone Encounter (Signed)
Preadmission screen  

## 2022-11-07 ENCOUNTER — Inpatient Hospital Stay (HOSPITAL_COMMUNITY)
Admission: AD | Admit: 2022-11-07 | Discharge: 2022-11-09 | DRG: 807 | Disposition: A | Discharge status: Home / Self Care | Payer: 59 | Attending: Obstetrics and Gynecology | Admitting: Obstetrics and Gynecology

## 2022-11-07 ENCOUNTER — Encounter (HOSPITAL_COMMUNITY): Payer: Self-pay | Admitting: Obstetrics and Gynecology

## 2022-11-07 ENCOUNTER — Telehealth: Payer: Self-pay

## 2022-11-07 DIAGNOSIS — Z3A40 40 weeks gestation of pregnancy: Secondary | ICD-10-CM

## 2022-11-07 DIAGNOSIS — O4290 Premature rupture of membranes, unspecified as to length of time between rupture and onset of labor, unspecified weeks of gestation: Secondary | ICD-10-CM | POA: Diagnosis present

## 2022-11-07 DIAGNOSIS — O99824 Streptococcus B carrier state complicating childbirth: Secondary | ICD-10-CM | POA: Diagnosis present

## 2022-11-07 DIAGNOSIS — O480 Post-term pregnancy: Secondary | ICD-10-CM | POA: Diagnosis present

## 2022-11-07 DIAGNOSIS — R8271 Bacteriuria: Secondary | ICD-10-CM

## 2022-11-07 DIAGNOSIS — O24419 Gestational diabetes mellitus in pregnancy, unspecified control: Secondary | ICD-10-CM | POA: Diagnosis present

## 2022-11-07 DIAGNOSIS — O26893 Other specified pregnancy related conditions, third trimester: Secondary | ICD-10-CM | POA: Diagnosis present

## 2022-11-07 DIAGNOSIS — O4202 Full-term premature rupture of membranes, onset of labor within 24 hours of rupture: Secondary | ICD-10-CM | POA: Diagnosis not present

## 2022-11-07 DIAGNOSIS — O24420 Gestational diabetes mellitus in childbirth, diet controlled: Principal | ICD-10-CM | POA: Diagnosis present

## 2022-11-07 DIAGNOSIS — O24410 Gestational diabetes mellitus in pregnancy, diet controlled: Secondary | ICD-10-CM | POA: Diagnosis not present

## 2022-11-07 DIAGNOSIS — O368130 Decreased fetal movements, third trimester, not applicable or unspecified: Secondary | ICD-10-CM | POA: Diagnosis not present

## 2022-11-07 DIAGNOSIS — Z302 Encounter for sterilization: Secondary | ICD-10-CM

## 2022-11-07 DIAGNOSIS — O4292 Full-term premature rupture of membranes, unspecified as to length of time between rupture and onset of labor: Principal | ICD-10-CM

## 2022-11-07 DIAGNOSIS — O99820 Streptococcus B carrier state complicating pregnancy: Secondary | ICD-10-CM | POA: Diagnosis not present

## 2022-11-07 LAB — TYPE AND SCREEN
ABO/RH(D): A POS
Antibody Screen: NEGATIVE

## 2022-11-07 LAB — CBC
HCT: 37.4 % (ref 36.0–46.0)
Hemoglobin: 12.7 g/dL (ref 12.0–15.0)
MCH: 31.3 pg (ref 26.0–34.0)
MCHC: 34 g/dL (ref 30.0–36.0)
MCV: 92.1 fL (ref 80.0–100.0)
Platelets: 217 10*3/uL (ref 150–400)
RBC: 4.06 MIL/uL (ref 3.87–5.11)
RDW: 14 % (ref 11.5–15.5)
WBC: 10.3 10*3/uL (ref 4.0–10.5)
nRBC: 0 % (ref 0.0–0.2)

## 2022-11-07 LAB — GLUCOSE, CAPILLARY
Glucose-Capillary: 82 mg/dL (ref 70–99)
Glucose-Capillary: 86 mg/dL (ref 70–99)

## 2022-11-07 LAB — POCT FERN TEST: POCT Fern Test: POSITIVE

## 2022-11-07 MED ORDER — LACTATED RINGERS IV SOLN: INTRAVENOUS | Status: DC

## 2022-11-07 MED ORDER — LIDOCAINE HCL (PF) 1 % IJ SOLN: 30.0000 mL | INTRAMUSCULAR | Status: DC | PRN

## 2022-11-07 MED ORDER — OXYCODONE-ACETAMINOPHEN 5-325 MG PO TABS: 2.0000 | ORAL_TABLET | ORAL | Status: DC | PRN

## 2022-11-07 MED ORDER — PENICILLIN G POT IN DEXTROSE 60000 UNIT/ML IV SOLN
3.0000 10*6.[IU] | INTRAVENOUS | Status: DC
  Administered 2022-11-07 – 2022-11-08 (×2): 3 10*6.[IU] via INTRAVENOUS
  Filled 2022-11-07 (×5): qty 50

## 2022-11-07 MED ORDER — OXYCODONE-ACETAMINOPHEN 5-325 MG PO TABS: 1.0000 | ORAL_TABLET | ORAL | Status: DC | PRN

## 2022-11-07 MED ORDER — SODIUM CHLORIDE 0.9 % IV SOLN
5.0000 10*6.[IU] | Freq: Once | INTRAVENOUS | Status: AC
  Administered 2022-11-07: 5 10*6.[IU] via INTRAVENOUS
  Filled 2022-11-07: qty 5

## 2022-11-07 MED ORDER — OXYTOCIN BOLUS FROM INFUSION
333.0000 mL | Freq: Once | INTRAVENOUS | Status: AC
  Administered 2022-11-08: 333 mL via INTRAVENOUS

## 2022-11-07 MED ORDER — SOD CITRATE-CITRIC ACID 500-334 MG/5ML PO SOLN: 30.0000 mL | ORAL | Status: DC | PRN

## 2022-11-07 MED ORDER — ACETAMINOPHEN 325 MG PO TABS
650.0000 mg | ORAL_TABLET | ORAL | Status: DC | PRN
  Administered 2022-11-08: 650 mg via ORAL
  Filled 2022-11-07: qty 2

## 2022-11-07 MED ORDER — ONDANSETRON HCL 4 MG/2ML IJ SOLN: 4.0000 mg | Freq: Four times a day (QID) | INTRAMUSCULAR | Status: DC | PRN

## 2022-11-07 MED ORDER — FLEET ENEMA 7-19 GM/118ML RE ENEM: 1.0000 | ENEMA | RECTAL | Status: DC | PRN

## 2022-11-07 MED ORDER — LACTATED RINGERS IV SOLN: 500.0000 mL | INTRAVENOUS | Status: DC | PRN

## 2022-11-07 MED ORDER — OXYTOCIN-SODIUM CHLORIDE 30-0.9 UT/500ML-% IV SOLN
2.5000 [IU]/h | INTRAVENOUS | Status: DC
  Administered 2022-11-08: 2.5 [IU]/h via INTRAVENOUS
  Filled 2022-11-07: qty 500

## 2022-11-07 NOTE — MAU Note (Signed)
Patricia Barber is a 32 y.o. at 70w0dhere in MAU reporting: water broke, cloudy/clear fluid, still coming. No bleeding. No pain. GDM with preg. Reports "very little" fetal movement.  Was 4+ yesterday Onset of complaint: 1130 Pain score: none Vitals:   11/07/22 1249  BP: 110/64  Pulse: 71  Resp: 18  Temp: 98 F (36.7 C)  SpO2: 99%     FHT:146 Lab orders placed from triage:

## 2022-11-07 NOTE — Plan of Care (Signed)
  Problem: Education: Goal: Knowledge of Childbirth will improve Outcome: Progressing Goal: Ability to make informed decisions regarding treatment and plan of care will improve Outcome: Progressing Goal: Ability to state and carry out methods to decrease the pain will improve Outcome: Progressing Goal: Individualized Educational Video(s) Outcome: Progressing   Problem: Coping: Goal: Ability to verbalize concerns and feelings about labor and delivery will improve Outcome: Progressing   Problem: Life Cycle: Goal: Ability to make normal progression through stages of labor will improve Outcome: Progressing Goal: Ability to effectively push during vaginal delivery will improve Outcome: Progressing   Problem: Role Relationship: Goal: Will demonstrate positive interactions with the child Outcome: Progressing   Problem: Safety: Goal: Risk of complications during labor and delivery will decrease Outcome: Progressing   Problem: Pain Management: Goal: Relief or control of pain from uterine contractions will improve Outcome: Progressing   Problem: Education: Goal: Knowledge of General Education information will improve Description: Including pain rating scale, medication(s)/side effects and non-pharmacologic comfort measures Outcome: Progressing   Problem: Health Behavior/Discharge Planning: Goal: Ability to manage health-related needs will improve Outcome: Progressing   Problem: Clinical Measurements: Goal: Ability to maintain clinical measurements within normal limits will improve Outcome: Progressing Goal: Will remain free from infection Outcome: Progressing Goal: Diagnostic test results will improve Outcome: Progressing Goal: Respiratory complications will improve Outcome: Progressing Goal: Cardiovascular complication will be avoided Outcome: Progressing   Problem: Nutrition: Goal: Adequate nutrition will be maintained Outcome: Progressing   Problem: Coping: Goal:  Level of anxiety will decrease Outcome: Progressing   Problem: Elimination: Goal: Will not experience complications related to bowel motility Outcome: Progressing Goal: Will not experience complications related to urinary retention Outcome: Progressing   Problem: Pain Managment: Goal: General experience of comfort will improve Outcome: Progressing   Problem: Safety: Goal: Ability to remain free from injury will improve Outcome: Progressing   Problem: Skin Integrity: Goal: Risk for impaired skin integrity will decrease Outcome: Progressing

## 2022-11-07 NOTE — Progress Notes (Signed)
Patient ID: Patricia Barber, female   DOB: 09-03-1990, 32 y.o.   MRN: [id number]  Lying in bed breathing w ctx; labor appears to be more advanced than when I previously saw pt; she's ready to get into the tub  VSS, afebrile FHR 140-150s, +accels, mi variables, no decels, Cat 1 Ctx 2-5 mins Cx deferred  CBG: 86  IUP'@40'$ .0wks GDMA1 Probable active labor  Will begin filling tub and sign consent Anticipate vag del  Myrtis Ser CNM 11/07/2022 10:10 PM

## 2022-11-07 NOTE — MAU Note (Signed)
Pt informed that the ultrasound is considered a limited OB ultrasound and is not intended to be a complete ultrasound exam.  Patient also informed that the ultrasound is not being completed with the intent of assessing for fetal or placental anomalies or any pelvic abnormalities.  Explained that the purpose of today's ultrasound is to assess for  presentation.  Patient acknowledges the purpose of the exam and the limitations of the study.    Vertex presentation confirmed by ultrasound.

## 2022-11-07 NOTE — Telephone Encounter (Signed)
Pt called stating she thinks her water broke. Pt states it was a large amount of water that came out and she couldn't stop it. Pt states it did not look or smell like urine. Pt was told to gather her bags and head to the hospital.

## 2022-11-07 NOTE — H&P (Addendum)
OBSTETRIC ADMISSION HISTORY AND PHYSICAL  Patricia Barber is a 32 y.o. female [phone] with IUP at 47w0dby 9w [place] presented to MAU for SROM check. ROM was confirmed with pooling and ferning. She reports decreased FMs,  no VB, no blurry vision. She plans on breast feeding. She request BTL vs vasectomy for birth control. She received her prenatal care at  [facility]    Dating: By 9w [place]--->  Estimated Date of Delivery: 11/07/22  Sono:    '@[redacted]w[redacted]d'$ , normal anatomy, cephalic presentation @ 357w6dEFW [id number], anterior placenta   Prenatal History/Complications:  - [id number]- GBS+   Past Medical History: Past Medical History:  Diagnosis Date   Anxiety    Gestational diabetes    Headache     Past Surgical History: Past Surgical History:  Procedure Laterality Date   WISDOM TOOTH EXTRACTION      Obstetrical History: OB History  Gravida Para Term Preterm AB Living  '4 2 2 '$ 0 1 2  SAB IAB Ectopic Multiple Live Births  1 0 0 0 2    # Outcome Date GA Lbr Len/2nd Weight Sex Delivery Anes PTL Lv  4 Current           3 Term 07/02/20 [id number] g M Vag-Spont Local  LIV     Name: Patricia Barber  Apgar1: 9  Apgar5: 9  2 Term 06/17/16 40w27w4d05 / 00:08 2951 g F Vag-Spont Local  LIV     Name: Patricia Barber Apgar1: 8  Apgar5: 9  1 SAB             Social History Social History   Socioeconomic History   Marital status: Married    Spouse name: Not on file   Number of children: Not on file   Years of education: Not on file   Highest education level: Not on file  Occupational History   Not on file  Tobacco Use   Smoking status: Never   Smokeless tobacco: Never  Vaping Use   Vaping Use: Never used  Substance and Sexual Activity   Alcohol use: No   Drug use: No   Sexual activity: Yes    Birth control/protection: I.U.D.  Other Topics Concern   Not on file  Social History Narrative   Not on file   Social Determinants of Health   Financial  Resource Strain: Not on file  Food Insecurity: Not on file  Transportation Needs: Not on file  Physical Activity: Not on file  Stress: Not on file  Social Connections: Not on file    Family History: Family History  Problem Relation Age of Onset   Asthma Mother    Hyperlipidemia Father    Hypertension Father    Migraines Brother    Diabetes Paternal Grandmother    Stroke Paternal Grandmother    Heart disease Paternal Grandfather    Hyperlipidemia Paternal Grandfather    Thyroid disease Neg Hx    Alzheimer's disease Neg Hx     Allergies: Allergies  Allergen Reactions   Lactose Diarrhea and Nausea And Vomiting    Medications Prior to Admission  Medication Sig Dispense Refill Last Dose   ferrous sulfate 325 (65 FE) MG EC tablet Take 1 tablet (325 mg total) by mouth daily with breakfast. 30 tablet 6 11/06/2022   Prenatal Vit-Fe Fumarate-FA (PRENATAL VITAMIN PO) Take by mouth.   11/06/2022   Vitamin D, Ergocalciferol, (DRISDOL)  1.25 MG (50000 UNIT) CAPS capsule Take 50,000 Units by mouth every 7 (seven) days.   Past Week   Accu-Chek Softclix Lancets lancets 1 each by Other route 4 (four) times daily. Use as instructed 100 each 12    blood glucose meter kit and supplies KIT Dispense based on patient and insurance preference. Use up to four times daily as directed. 1 each 0    Blood Glucose Monitoring Suppl (D-CARE GLUCOMETER) w/Device KIT 1 Device by Does not apply route 4 (four) times daily. 1 kit 0    glucose blood (D-CARE BLOOD GLUCOSE) test strip Use as instructed 100 each 12      Review of Systems   All systems reviewed and negative except as stated in HPI  Blood pressure 110/64, pulse 70, temperature 98 F (36.7 C), temperature source Oral, resp. rate 18, height '5\' 4"'$  (1.626 m), weight 94.1 kg, last menstrual period 01/23/2022, SpO2 99 %. General appearance: alert and no distress Lungs: normal work of breathing Heart: normal rate   Presentation: cephalic by bedside US.  See separate note Fetal monitoringBaseline: 150 bpm, Variability: moderate, Accelerations: present, and Decelerations: Absent Uterine activity Frequency: Every 1-2 minutes     Prenatal labs: ABO, Rh: A/RH(D) POSITIVE/-- (08/10 1103) Antibody: NO ANTIBODIES DETECTED (08/10 1103) Rubella: 1.73 (08/10 1103) RPR: Non Reactive (12/20 0832)  HBsAg: NON-REACTIVE (08/10 1103)  HIV: Non Reactive (12/20 0832)  GBS:   positive GBS in urine culture. 2 hr GTT 128 / 131 / 132 Genetic screening  NML Anatomy US wnl  Prenatal Transfer Tool  Maternal Diabetes: Yes:  Diabetes Type:  Diet controlled Genetic Screening: Normal Maternal Ultrasounds/Referrals: Normal Fetal Ultrasounds or other Referrals:  None Maternal Substance Abuse:  No Significant Maternal Medications:  None Significant Maternal Lab Results:  Group B Strep positive Number of Prenatal Visits:greater than 3 verified prenatal visits Other Comments:  None  Results for orders placed or performed during the hospital encounter of 11/07/22 (from the past 24 hour(s))  POCT fern test   Collection Time: 11/07/22  1:40 PM  Result Value Ref Range   POCT Fern Test Positive = ruptured amniotic membanes     Patient Active Problem List   Diagnosis Date Noted   Request for sterilization 09/13/2022   Gestational diabetes 08/17/2022   GBS bacteriuria 04/09/2022   Supervision of other normal pregnancy, antepartum 04/03/2022    Assessment/Plan:  Patricia Barber is a 32 y.o. [id number] at 75w0dhere for labor. She desires water birth. Consent signed  #Labor: continue expectant management #Pain: Desires unmedicated for water birth.  SROM at home around 1130 #FWB: Eligible for intermittent auscultation #ID:  GBS pos - PCN started while in MAU #MOF: breast #MOC: BTL vs Vasectomy #Circ:  Desired  #A1GDM: fingerstick on admission 881  Seen with Patricia Barber CNM Patricia Barber "Patricia Barber M.D. PGY-2 Family Medicine Visiting Resident Faculty  Practice 11/07/2022 1:43 PM   CNM attestation:  I have seen and examined this patient; I agree with above documentation in the resident's note.   NBitania Barber a 32y.o. [id number] for SROM/latent labor; GDMA1 with EFW 58th% @ 318w6dnd good CBG values in preg (didn't check x 2-3wks but decent numbers before and after hiatus); desires waterbirth; states ctx are slowly becoming more intense  PE: BP 125/81   Pulse 81   Temp 98.9 F (37.2 C) (Oral)   Resp 18   Ht '5\' 4"'$  (1.626 m)   Wt 94.1 kg   LMP  01/23/2022   SpO2 99%   BMI 35.62 kg/m  Gen: breathing w some ctx Resp: normal effort, no distress Abd: gravid  ROS, labs, PMH reviewed  Plan: -Admit to Labor and Delivery -Expectant management; will visit the issue of augmentation prn -Continue PCN for GBS ppx - Periodic CBGs -Plan for waterbirth as labor progresses  Myrtis Ser CNM 11/07/2022, 7:05 PM

## 2022-11-07 NOTE — MAU Provider Note (Signed)
Event Date/Time   First Provider Initiated Contact with Patient 11/07/22 1334       S: Ms. Patricia Barber is a 32 y.o. [id number] at [redacted]w[redacted]d who presents to MAU today complaining of leaking of fluid since 1130. She denies vaginal bleeding. She denies contractions. She reports normal fetal movement.    O: BP 110/64 (BP Location: Right Arm)   Pulse 70   Temp 98 F (36.7 C) (Oral)   Resp 18   Ht '5\' 4"'$  (1.626 m)   Wt 94.1 kg   LMP 01/23/2022   SpO2 99%   BMI 35.62 kg/m  GENERAL: Well-developed, well-nourished female in no acute distress.  HEAD: Normocephalic, atraumatic.  CHEST: Normal effort of breathing, regular heart rate ABDOMEN: Soft, nontender, gravid PELVIC: Normal external female genitalia. Vagina is pink and rugated. Cervix with normal contour, no lesions. Normal discharge.  + pooling.   Cervical exam: deferred     Fetal Monitoring: Baseline: 145 Variability: moderate Accelerations: 15x15 Decelerations: no Contractions: Q 2-5 minutes  Results for orders placed or performed during the hospital encounter of 11/07/22 (from the past 24 hour(s))  POCT fern test     Status: Abnormal   Collection Time: 11/07/22  1:40 PM  Result Value Ref Range   POCT Fern Test Positive = ruptured amniotic membanes      A: SIUP at 4103w0dSROM  P: Admit to birthing suites Care turned over to labor team Penicillin ordered for GBS+  LaJorje GuildNP 11/07/2022 1:51 PM

## 2022-11-08 ENCOUNTER — Encounter (HOSPITAL_COMMUNITY): Payer: Self-pay | Admitting: Obstetrics and Gynecology

## 2022-11-08 ENCOUNTER — Other Ambulatory Visit: Payer: Self-pay

## 2022-11-08 DIAGNOSIS — O99820 Streptococcus B carrier state complicating pregnancy: Secondary | ICD-10-CM

## 2022-11-08 DIAGNOSIS — O24420 Gestational diabetes mellitus in childbirth, diet controlled: Secondary | ICD-10-CM

## 2022-11-08 DIAGNOSIS — Z3A40 40 weeks gestation of pregnancy: Secondary | ICD-10-CM

## 2022-11-08 DIAGNOSIS — O4202 Full-term premature rupture of membranes, onset of labor within 24 hours of rupture: Secondary | ICD-10-CM

## 2022-11-08 DIAGNOSIS — O368130 Decreased fetal movements, third trimester, not applicable or unspecified: Secondary | ICD-10-CM

## 2022-11-08 DIAGNOSIS — O480 Post-term pregnancy: Secondary | ICD-10-CM

## 2022-11-08 LAB — GLUCOSE, CAPILLARY: Glucose-Capillary: 122 mg/dL — ABNORMAL HIGH (ref 70–99)

## 2022-11-08 LAB — RPR: RPR Ser Ql: NONREACTIVE

## 2022-11-08 MED ORDER — MEASLES, MUMPS & RUBELLA VAC IJ SOLR: 0.5000 mL | Freq: Once | INTRAMUSCULAR | Status: DC

## 2022-11-08 MED ORDER — OXYCODONE HCL 5 MG PO TABS: 5.0000 mg | ORAL_TABLET | ORAL | Status: DC | PRN

## 2022-11-08 MED ORDER — BENZOCAINE-MENTHOL 20-0.5 % EX AERO: 1.0000 | INHALATION_SPRAY | CUTANEOUS | Status: DC | PRN

## 2022-11-08 MED ORDER — SIMETHICONE 80 MG PO CHEW: 80.0000 mg | CHEWABLE_TABLET | ORAL | Status: DC | PRN

## 2022-11-08 MED ORDER — ONDANSETRON HCL 4 MG PO TABS: 4.0000 mg | ORAL_TABLET | ORAL | Status: DC | PRN

## 2022-11-08 MED ORDER — PRENATAL MULTIVITAMIN CH
1.0000 | ORAL_TABLET | Freq: Every day | ORAL | Status: DC
  Administered 2022-11-08 – 2022-11-09 (×2): 1 via ORAL
  Filled 2022-11-08 (×2): qty 1

## 2022-11-08 MED ORDER — WITCH HAZEL-GLYCERIN EX PADS: 1.0000 | MEDICATED_PAD | CUTANEOUS | Status: DC | PRN

## 2022-11-08 MED ORDER — SENNOSIDES-DOCUSATE SODIUM 8.6-50 MG PO TABS
2.0000 | ORAL_TABLET | ORAL | Status: DC
  Administered 2022-11-08 – 2022-11-09 (×2): 2 via ORAL
  Filled 2022-11-08 (×2): qty 2

## 2022-11-08 MED ORDER — IBUPROFEN 600 MG PO TABS
600.0000 mg | ORAL_TABLET | Freq: Four times a day (QID) | ORAL | Status: DC
  Administered 2022-11-08 – 2022-11-09 (×6): 600 mg via ORAL
  Filled 2022-11-08 (×6): qty 1

## 2022-11-08 MED ORDER — ONDANSETRON HCL 4 MG/2ML IJ SOLN: 4.0000 mg | INTRAMUSCULAR | Status: DC | PRN

## 2022-11-08 MED ORDER — DIPHENHYDRAMINE HCL 25 MG PO CAPS: 25.0000 mg | ORAL_CAPSULE | Freq: Four times a day (QID) | ORAL | Status: DC | PRN

## 2022-11-08 MED ORDER — ACETAMINOPHEN 325 MG PO TABS: 650.0000 mg | ORAL_TABLET | ORAL | Status: DC | PRN

## 2022-11-08 MED ORDER — DIBUCAINE (PERIANAL) 1 % EX OINT: 1.0000 | TOPICAL_OINTMENT | CUTANEOUS | Status: DC | PRN

## 2022-11-08 MED ORDER — COCONUT OIL OIL
1.0000 | TOPICAL_OIL | Status: DC | PRN
  Administered 2022-11-09: 1 via TOPICAL

## 2022-11-08 MED ORDER — ZOLPIDEM TARTRATE 5 MG PO TABS: 5.0000 mg | ORAL_TABLET | Freq: Every evening | ORAL | Status: DC | PRN

## 2022-11-08 MED ORDER — TETANUS-DIPHTH-ACELL PERTUSSIS 5-2.5-18.5 LF-MCG/0.5 IM SUSY: 0.5000 mL | PREFILLED_SYRINGE | Freq: Once | INTRAMUSCULAR | Status: DC

## 2022-11-08 NOTE — Discharge Summary (Signed)
Postpartum Discharge Summary     Patient Name: Patricia Barber DOB: [birth year] MRN: [id number]  Date of admission: 11/07/2022 Delivery date:11/08/2022  Delivering provider: Serita Grammes D  Date of discharge: 11/09/2022  Admitting diagnosis: Amniotic fluid leaking [O42.90] Intrauterine pregnancy: [redacted]w[redacted]d     Secondary diagnosis:  Principal Problem:   Amniotic fluid leaking Active Problems:   GBS bacteriuria   Gestational diabetes  Additional problems: none    Discharge diagnosis: Term Pregnancy Delivered and GDM A1                                              Post partum procedures: none Augmentation:  none Complications: None  Hospital course: Onset of Labor With Vaginal Delivery      32 y.o. yo [id number] at [redacted]w[redacted]d was admitted in Latent Labor with SROM @ 1130 on 11/07/2022. Labor course was uncomplicated with labor/delivery in the tub.  Membrane Rupture Time/Date: 11:30 AM ,11/07/2022   Delivery Method:Vaginal, Spontaneous  Episiotomy: None  Lacerations:  None  Patient had a uncomplicated postpartum course. She is ambulating, tolerating a regular diet, passing flatus, and urinating well. Patient is discharged home in stable condition on 11/09/22.  Newborn Data: Birth date:11/08/2022  Birth time:12:53 AM  Gender:Female  Living status:Living  Apgars:8 ,9  Weight:3150 g (6lb 15.1oz)  Magnesium Sulfate received: No BMZ received: No Rhophylac:N/A MMR:N/A T-DaP:Given prenatally Flu: Yes Transfusion:No  Physical exam  Vitals:   11/08/22 1215 11/08/22 1456 11/08/22 2113 11/09/22 0640  BP: 108/71 111/68 124/71 (!) 93/46  Pulse: 83 77 78 (!) 53  Resp: 17 18 16 18   Temp: 97.7 F (36.5 C) (!) 97.5 F (36.4 C) 97.6 F (36.4 C) 98 F (36.7 C)  TempSrc: Oral Oral Oral Oral  SpO2: 100% 100% 97% 96%  Weight:      Height:       General: alert, cooperative, and no distress Lochia: appropriate Uterine Fundus: firm Incision: N/A DVT Evaluation: No evidence of DVT seen on  physical exam. Labs: Lab Results  Component Value Date   WBC 10.3 11/07/2022   HGB 12.7 11/07/2022   HCT 37.4 11/07/2022   MCV 92.1 11/07/2022   PLT 217 11/07/2022      Latest Ref Rng & Units 04/27/2019    8:34 AM  CMP  Glucose 65 - 99 mg/dL 86   BUN 7 - 25 mg/dL 14   Creatinine 0.50 - 1.10 mg/dL 0.84   Sodium 135 - 146 mmol/L 137   Potassium 3.5 - 5.3 mmol/L 4.1   Chloride 98 - 110 mmol/L 105   CO2 20 - 32 mmol/L 26   Calcium 8.6 - 10.2 mg/dL 9.2   Total Protein 6.1 - 8.1 g/dL 7.0   Total Bilirubin 0.2 - 1.2 mg/dL 0.5   AST 10 - 30 U/L 20   ALT 6 - 29 U/L 18    Edinburgh Score:    11/09/2022    6:40 AM  Edinburgh Postnatal Depression Scale Screening Tool  I have been able to laugh and see the funny side of things. 0  I have looked forward with enjoyment to things. 0  I have blamed myself unnecessarily when things went wrong. 0  I have been anxious or worried for no good reason. 0  I have felt scared or panicky for no good reason. 0  Things have been getting  on top of me. 0  I have been so unhappy that I have had difficulty sleeping. 0  I have felt sad or miserable. 0  I have been so unhappy that I have been crying. 0  The thought of harming myself has occurred to me. 0  Edinburgh Postnatal Depression Scale Total 0     After visit meds:  Allergies as of 11/09/2022       Reactions   Lactose Diarrhea, Nausea And Vomiting        Medication List     STOP taking these medications    Accu-Chek Softclix Lancets lancets   blood glucose meter kit and supplies Kit   D-Care Blood Glucose test strip Generic drug: glucose blood   D-Care Glucometer w/Device Kit   ferrous sulfate 325 (65 FE) MG EC tablet       TAKE these medications    acetaminophen 325 MG tablet Commonly known as: Tylenol Take 2 tablets (650 mg total) by mouth every 4 (four) hours as needed (for pain scale < 4).   benzocaine-Menthol 20-0.5 % Aero Commonly known as: DERMOPLAST Apply 1  Application topically as needed for irritation (perineal discomfort).   ibuprofen 600 MG tablet Commonly known as: ADVIL Take 1 tablet (600 mg total) by mouth every 6 (six) hours.   PRENATAL VITAMIN PO Take by mouth.   Vitamin D (Ergocalciferol) 1.25 MG (50000 UNIT) Caps capsule Commonly known as: DRISDOL Take 50,000 Units by mouth every 7 (seven) days.         Discharge home in stable condition Infant Feeding: Bottle and Breast Infant Disposition:home with mother Discharge instruction: per After Visit Summary and Postpartum booklet. Activity: Advance as tolerated. Pelvic rest for 6 weeks.  Diet: routine diet Future Appointments: Future Appointments  Date Time Provider [place]  12/10/2022 10:10 AM Guss Bunde, MD [facility]   Follow up Visit:  (Postpartum visit already scheduled)   11/09/2022 Concepcion Living, MD

## 2022-11-08 NOTE — Lactation Note (Signed)
This note was copied from a baby's chart. Lactation Consultation Note  Patient Name: Patricia Barber [id number] Date: 11/08/2022 Age:32 hours  Attempted to see mom and she was sleeping.    Maternal Data    Feeding    LATCH Score Latch: Repeated attempts needed to sustain latch, nipple held in mouth throughout feeding, stimulation needed to elicit sucking reflex.  Audible Swallowing: None  Type of Nipple: Everted at rest and after stimulation  Comfort (Breast/Nipple): Soft / non-tender  Hold (Positioning): No assistance needed to correctly position infant at breast.  LATCH Score: 7   Lactation Tools Discussed/Used    Interventions    Discharge    Consult Status      Theodoro Kalata 11/08/2022, 3:44 AM

## 2022-11-08 NOTE — Progress Notes (Signed)
Patient ID: Patricia Barber, female   DOB: 05-04-1991, 32 y.o.   MRN: [id number]  Has been in tub for awhile and coping well; requests exam  VSS, afebrile FHR dopplered 140-150s Ctx q 2-4 mins Cx 8/70/vtx -2; floppy forebag AROM'd for clear fluid, which helped head to settle against cx  IUP'@40'$ .1wks GDMA1 ROM x 12h Active labor  Will get 3rd dose of PCN and be on EFM for a little while in the bed  Myrtis Ser CNM 11/08/2022

## 2022-11-08 NOTE — Lactation Note (Signed)
This note was copied from a baby's chart. Lactation Consultation Note  Patient Name: Boy Zuliana Dupont [id number] Date: 11/08/2022 Age:32 hours  Reason for consult: Initial assessment;Term, GDM (diet)  P3, 40.1 GA, water birth delivery  Mother states baby has breast fed well. Infant sleeping now. She denies any concerns but would like to call when baby breastfeeds for a consult.  Encourage skin to skin, observing for feeding cues and call for assist as needed.  Mother given handout of O/P services, breastfeeding support groups, and our phone # for post-discharge questions.     Maternal Data Has patient been taught Hand Expression?: No Does the patient have breastfeeding experience prior to this delivery?: Yes How long did the patient breastfeed?: 1st baby: pumped 8 months, 2nd baby: breastfed for 8 months  Feeding Mother's Current Feeding Choice: Breast Milk   Interventions  Handout of [place] OP services  Discharge Pump: DEBP;Personal  Consult Status Consult Status: Follow-up Date: 11/09/22 Follow-up type: In-patient    Stana Bunting M 11/08/2022, 10:11 AM

## 2022-11-09 MED ORDER — IBUPROFEN 600 MG PO TABS: 600.0000 mg | ORAL_TABLET | Freq: Four times a day (QID) | ORAL | 0 refills | Status: DC

## 2022-11-09 MED ORDER — BENZOCAINE-MENTHOL 20-0.5 % EX AERO: 1.0000 | INHALATION_SPRAY | CUTANEOUS | 0 refills | Status: DC | PRN

## 2022-11-09 MED ORDER — ACETAMINOPHEN 325 MG PO TABS: 650.0000 mg | ORAL_TABLET | ORAL | 0 refills | Status: DC | PRN

## 2022-11-09 NOTE — Progress Notes (Signed)
Circumcision Consent  Discussed with mom at bedside about circumcision.   Circumcision is a surgery that removes the skin that covers the tip of the penis, called the "foreskin." Circumcision is usually done when a boy is between 22 and 57 days old, sometimes up to 62-6 weeks old.  The most common reasons boys are circumcised include for cultural/religious beliefs or for parental preference (potentially easier to clean, so baby looks like daddy, etc).  There may be some medical benefits for circumcision:   Circumcised boys seem to have slightly lower rates of: ? Urinary tract infections (per the American Academy of Pediatrics an uncircumcised boy has a 1/100 chance of developing a UTI in the first year of life, a circumcised boy at a 08/998 chance of developing a UTI in the first year of life- a 10% reduction) ? Penis cancer (typically rare- an uncircumcised female has a 1 in 100,000 chance of developing cancer of the penis) ? Sexually transmitted infection (in endemic areas, including HIV, HPV and Herpes- circumcision does NOT protect against gonorrhea, chlamydia, trachomatis, or syphilis) ? Phimosis: a condition where that makes retraction of the foreskin over the glans impossible (0.4 per 1000 boys per year or 0.6% of boys are affected by their 15th birthday)  Boys and men who are not circumcised can reduce these extra risks by: ? Cleaning their penis well ? Using condoms during sex  What are the risks of circumcision?  As with any surgical procedure, there are risks and complications. In circumcision, complications are rare and usually minor, the most common being: ? Bleeding- risk is reduced by holding each clamp for 30 seconds prior to a cut being made, and by holding pressure after the procedure is done ? Infection- the penis is cleaned prior to the procedure, and the procedure is done under sterile technique ? Damage to the urethra or amputation of the penis  How is circumcision done  in baby boys?  The baby will be placed on a special table and the legs restrained for their safety. Numbing medication is injected into the penis, and the skin is cleansed with betadine to decrease the risk of infection.   What to expect:  The penis will look red and raw for 5-7 days as it heals. We expect scabbing around where the cut was made, as well as clear-pink fluid and some swelling of the penis right after the procedure. If your baby's circumcision starts to bleed or develops pus, please contact your pediatrician immediately.  All questions were answered and mother consented.  Concepcion Living, MD 1:53 PM

## 2022-11-09 NOTE — Social Work (Signed)
MOB was referred for history of anxiety.  * Referral screened out by Clinical Social Worker because none of the following criteria appear to apply:  ~ History of anxiety/depression during this pregnancy, or of post-partum depression following prior delivery.  ~ Diagnosis of anxiety and/or depression within last 3 years OR * MOB's symptoms currently being treated with medication and/or therapy.  Per chart review MOB diagnosis dates back to 2017, Per OB records no noted concerns during pregnancy  Please contact the Clinical Social Worker if needs arise, or by MOB request.  Letta Kocher, [place] Social Worker [phone]

## 2022-11-09 NOTE — Progress Notes (Addendum)
Patient ID: Patricia Barber, female   DOB: April 16, 1991, 32 y.o.   MRN: [id number]  POSTPARTUM PROGRESS NOTE  Post Partum Day 1  Subjective:  Tsuyako Schnackenberg is a 32 y.o. [id number] s/p SVB at [redacted]w[redacted]d.  She reports she is doing well. No acute events overnight. She denies any problems with ambulating, voiding or po intake. Denies nausea or vomiting. No headache, dizziness, visual changes, CP, SOB. Pain is well controlled.  Lochia is improving; mild to moderate.  Objective: Blood pressure (!) 93/46, pulse (!) 53, temperature 98 F (36.7 C), temperature source Oral, resp. rate 18, height 5\' 4"  (1.626 m), weight 94.1 kg, last menstrual period 01/23/2022, SpO2 96 %, unknown if currently breastfeeding.  Physical Exam:  General: alert, cooperative and no distress Chest: no respiratory distress Abdomen: soft, nontender,  Uterine Fundus: firm, appropriately tender DVT Evaluation: No calf swelling or tenderness Extremities: Minimal edema Skin: warm, dry  Recent Labs    11/07/22 1350  HGB 12.7  HCT 37.4    Assessment/Plan: Jacqueleen Werman is a 32 y.o. [id number] s/p SVB at 100w1d   PPD#1  - Doing well   Routine postpartum care - Contraception: Partner planning vasectomy; pt planning depo shot or other short-term contraceptive to get in outpatient office visit - Feeding: Breast; requesting additional lactation consult before dispo  Dispo - Plan for discharge today, pending circ.   LOS: 2 days   Carmelina Dane, Medical Student 11/09/2022, 8:32 AM  Resident Attestation  I saw and evaluated the patient, performing the key elements of the service.I  personally performed or re-performed the history, physical exam, and medical decision making activities of this service and have verified that the service and findings are accurately documented in the student's note. I developed the management plan that is described in the medical student's note, and I agree with the content, with my edits above.    Gifford Shave, MD

## 2022-11-09 NOTE — Lactation Note (Signed)
This note was copied from a baby's chart. Lactation Consultation Note  Patient Name: Patricia Barber [id number] Date: 11/09/2022 Age:32 hours  Reason for consult: Follow-up assessment;Term;Breastfeeding assistance  P3, 40.1 GA, 5% weight loss  Mother has exclusively breast fed baby. She reports her nipples are getting sore and appear pinched after baby latches. Baby has a slightly recessed chin. Observed a latch that mother stated was comfortable and noted that baby was dimpling cheeks and shallow attachment to breast. Assisted mother with an asymmetrical latch in an upright football hold. Infant did not have dimpling of cheeks and mother denied nipple pain. Mother has successful breastfeeding experience with her previous babies and knowledgeable of an effective latch. She is using coconut oil on her nipples.  Mother hopeful that she and baby will be discharged today. Mom made aware of O/P services, breastfeeding support groups, community resources, and our phone # for post-discharge questions.     Maternal Data Has patient been taught Hand Expression?: Yes Does the patient have breastfeeding experience prior to this delivery?: Yes  Feeding Mother's Current Feeding Choice: Breast Milk  LATCH Score Latch: Grasps breast easily, tongue down, lips flanged, rhythmical sucking.  Audible Swallowing: A few with stimulation  Type of Nipple: Everted at rest and after stimulation  Comfort (Breast/Nipple): Filling, red/small blisters or bruises, mild/mod discomfort  Hold (Positioning): Assistance needed to correctly position infant at breast and maintain latch.  LATCH Score: 7   Lactation Tools Discussed/Used    Interventions Interventions: Breast feeding basics reviewed;Assisted with latch;Adjust position;Support pillows;Coconut oil;Education;Breast massage;Breast compression  Discharge Pump: DEBP;Personal  Consult Status Consult Status: Follow-up Date: 11/10/22    Gwenevere Abbot 11/09/2022, 10:18 AM

## 2022-11-10 ENCOUNTER — Inpatient Hospital Stay (HOSPITAL_COMMUNITY): Payer: 59

## 2022-11-10 ENCOUNTER — Inpatient Hospital Stay (HOSPITAL_COMMUNITY): Admission: RE | Admit: 2022-11-10 | Payer: 59 | Source: Home / Self Care | Admitting: Obstetrics and Gynecology

## 2022-12-05 NOTE — Progress Notes (Signed)
    Post Partum Visit Note  Patricia Barber is a 32 y.o. [id number] female who presents for a postpartum visit. She is 4 weeks postpartum following a normal spontaneous vaginal delivery.  I have fully reviewed the prenatal and intrapartum course. The delivery was at 40.1 gestational weeks.  Anesthesia: none. Postpartum course has been unremarkable. Baby is doing well. Baby is feeding by breast. Bleeding no bleeding. Bowel function is normal. Bladder function is normal. Patient is not sexually active. Contraception method is vasectomy. Postpartum depression screening: negative.   The pregnancy intention screening data noted above was reviewed. Potential methods of contraception were discussed. The patient elected to proceed with depo until partner has vasectomy.    There are no preventive care reminders to display for this patient.  The following portions of the patient's history were reviewed and updated as appropriate: allergies, current medications, past family history, past medical history, past social history, past surgical history, and problem list.  Review of Systems Pertinent items noted in HPI and remainder of comprehensive ROS otherwise negative.  Objective:  There were no vitals taken for this visit.   General:  alert, cooperative, and no distress   Breasts:  normal  Lungs: clear to auscultation bilaterally  Heart:  regular rate and rhythm  Abdomen: soft, non-tender; bowel sounds normal; no masses,  no organomegaly   Wound N/a  GU exam:   No bleeding, no lacs or pai, cervix no lesions.        Assessment:    nml postpartum exam.   Plan:   Essential components of care per ACOG recommendations:  1.  Mood and well being: Patient with negative depression screening today. Reviewed local resources for support.  - Patient tobacco use? No.   - hx of drug use? No.    2. Infant care and feeding:  -Patient currently breastmilk feeding? Yes. Reviewed importance of draining breast  regularly to support lactation.  -Social determinants of health (SDOH) reviewed in EPIC. No concerns  3. Sexuality, contraception and birth spacing - Patient does not want a pregnancy in the next year.  Desired family size is 3 children.  - Reviewed reproductive life planning. Reviewed contraceptive methods based on pt preferences and effectiveness.  Patient desired Vasectomy today.   - Discussed birth spacing of 18 months  4. Sleep and fatigue -Encouraged family/partner/community support of 4 hrs of uninterrupted sleep to help with mood and fatigue  5. Physical Recovery  - Discussed patients delivery and complications. She describes her labor as good. - Patient had a Vaginal, no problems at delivery. Patient had a  no  laceration. Perineal healing reviewed. Patient expressed understanding - Patient has urinary incontinence? No. - Patient is safe to resume physical and sexual activity  6.  Health Maintenance - HM due items addressed Yes--Needs GTT in next few weeks - Last pap smear  Diagnosis  Date Value Ref Range Status  04/20/2019   Final   NEGATIVE FOR INTRAEPITHELIAL LESIONS OR MALIGNANCY.   Pap smear not done at today's visit.  Pap smear was done in July 2023.  The result is not released yet.  Nursing staff notified and they will contact pathology. -Breast Cancer screening indicated? No.   7. Chronic Disease/Pregnancy Condition follow up: Gestational Diabetes 2-hour GTT in the coming weeks.  Elsie Lincoln, MD  [facility] [employer], [facility]

## 2022-12-10 ENCOUNTER — Encounter: Payer: Self-pay | Admitting: Obstetrics & Gynecology

## 2022-12-10 ENCOUNTER — Ambulatory Visit (INDEPENDENT_AMBULATORY_CARE_PROVIDER_SITE_OTHER): Payer: 59 | Admitting: Obstetrics & Gynecology

## 2022-12-10 VITALS — BP 111/70 | HR 62 | Ht 64.0 in | Wt 197.0 lb

## 2022-12-10 DIAGNOSIS — Z3480 Encounter for supervision of other normal pregnancy, unspecified trimester: Secondary | ICD-10-CM

## 2022-12-10 DIAGNOSIS — Z8632 Personal history of gestational diabetes: Secondary | ICD-10-CM | POA: Insufficient documentation

## 2022-12-26 ENCOUNTER — Other Ambulatory Visit (INDEPENDENT_AMBULATORY_CARE_PROVIDER_SITE_OTHER): Payer: 59

## 2022-12-26 DIAGNOSIS — Z8632 Personal history of gestational diabetes: Secondary | ICD-10-CM

## 2022-12-27 LAB — GLUCOSE TOLERANCE, 2 HOURS W/ 1HR
Glucose, 1 hour: 108 mg/dL (ref 70–179)
Glucose, 2 hour: 97 mg/dL (ref 70–152)
Glucose, Fasting: 79 mg/dL (ref 70–91)

## 2023-12-16 ENCOUNTER — Ambulatory Visit: Admitting: Obstetrics & Gynecology

## 2023-12-16 ENCOUNTER — Encounter: Payer: Self-pay | Admitting: Obstetrics & Gynecology

## 2023-12-16 VITALS — BP 116/79 | HR 64 | Ht 64.0 in | Wt 213.0 lb

## 2023-12-16 DIAGNOSIS — Z3202 Encounter for pregnancy test, result negative: Secondary | ICD-10-CM

## 2023-12-16 DIAGNOSIS — N921 Excessive and frequent menstruation with irregular cycle: Secondary | ICD-10-CM | POA: Diagnosis not present

## 2023-12-16 DIAGNOSIS — R102 Pelvic and perineal pain: Secondary | ICD-10-CM

## 2023-12-16 LAB — POCT URINE PREGNANCY: Preg Test, Ur: NEGATIVE

## 2023-12-16 NOTE — Progress Notes (Addendum)
  Pt would like to discuss PCOS and perimenopause, also noticed intermittent pain around left ovary x 6 weeks.   Subjective:     Patricia Barber is a 33 y.o. female here for a routine exam.  Current complaints: has had 3 periods since delivery.  The last 1 was very light.  We did a UPT today and is negative.  She will keep a good menstrual diary to see if her periods are still regular after her last baby..  Husband had vasectomy and going for confirmatory test.  Pt also c/o left lower quadrant pain or 6 weeks.  It persisted through her menstrual cycle.  Pain is pulling and moderate at times.     Gynecologic History Patient's last menstrual period was 12/06/2023 (exact date). Contraception: vasectomy Last pap smear (date and result):03/05/2022-WNL Last mammogram (date and result):n/a Last colon screening (date and result):n/a Brush:yes Floss:yes Seatbelts: yes Sunscreen: yes  Obstetric History OB History  Gravida Para Term Preterm AB Living  4 3 3  0 1 3  SAB IAB Ectopic Multiple Live Births  1 0 0 0 3    # Outcome Date GA Lbr Len/2nd Weight Sex Type Anes PTL Lv  4 Term 11/08/22 [redacted]w[redacted]d / 00:07 6 lb 15.1 oz (3.15 kg) M Vag-Spont None  LIV     Birth Comments: WDL  3 Term 07/02/20 [redacted]w[redacted]d  6 lb 7.9 oz (2.946 kg) M Vag-Spont Local  LIV  2 Term 06/17/16 [redacted]w[redacted]d 18:05 / 00:08 6 lb 8.1 oz (2.951 kg) F Vag-Spont Local  LIV  1 SAB              The following portions of the patient's history were reviewed and updated as appropriate: allergies, current medications, past family history, past medical history, past social history, past surgical history, and problem list.  Review of Systems Pertinent items noted in HPI and remainder of comprehensive ROS otherwise negative.    Objective:     Vitals:   12/16/23 1032  BP: 116/79  Pulse: 64  Weight: 213 lb (96.6 kg)  Height: 5\' 4"  (1.626 m)   Vitals:  WNL General appearance: alert, cooperative and no distress  HEENT: Normocephalic, without  obvious abnormality, atraumatic Eyes: negative Throat: lips, mucosa, and tongue normal; teeth and gums normal  Respiratory: Clear to auscultation bilaterally  CV: Regular rate and rhythm  Breasts:  Normal appearance, no masses or tenderness, no nipple retraction or dimpling  GI: Soft, non-tender; bowel sounds normal; no masses,  no organomegaly  GU: External Genitalia:  Tanner V, no lesion Urethra:  No prolapse   Vagina: Pink, normal rugae, no blood or discharge  Cervix: No CMT, no lesion  Uterus:  Normal size and contour, non tender  Adnexa: Tenderness over left ovary, ?slightly enlarged; exam limited.   Musculoskeletal: No edema, redness or tenderness in the calves or thighs  Skin: No lesions or rash  Lymphatic: Axillary adenopathy: none     Psychiatric: Normal mood and behavior        Assessment:    Healthy female exam.    Plan:    UPT negative- Vasectomy--needs confirmatory testing Keep menstrual diary and report going 3 months without a mense, menses that lat more than 10 days, or bleeding twice in a month.  Left lower quadrant pain--pelvic US  complete with TVUS

## 2023-12-24 ENCOUNTER — Ambulatory Visit (INDEPENDENT_AMBULATORY_CARE_PROVIDER_SITE_OTHER)

## 2023-12-24 DIAGNOSIS — R102 Pelvic and perineal pain: Secondary | ICD-10-CM | POA: Diagnosis not present

## 2023-12-27 ENCOUNTER — Encounter: Payer: Self-pay | Admitting: Obstetrics & Gynecology
